# Patient Record
Sex: Female | Born: 1960 | Race: White | Hispanic: No | Smoking: Current every day smoker
Health system: Southern US, Community
[De-identification: ages and names within clinical notes are randomized; demographics above are authoritative.]

## PROBLEM LIST (undated history)

## (undated) ENCOUNTER — Emergency Department (HOSPITAL_COMMUNITY): Payer: Medicaid Other

## (undated) DIAGNOSIS — K219 Gastro-esophageal reflux disease without esophagitis: Secondary | ICD-10-CM

## (undated) HISTORY — PX: APPENDECTOMY: SHX54

---

## 2017-01-27 ENCOUNTER — Inpatient Hospital Stay (HOSPITAL_COMMUNITY)
Admission: AD | Admit: 2017-01-27 | Discharge: 2017-02-01 | DRG: 166 | Disposition: A | Discharge status: Home / Self Care | Payer: Medicaid Other | Source: Other Acute Inpatient Hospital | Attending: Internal Medicine | Admitting: Internal Medicine

## 2017-01-27 ENCOUNTER — Encounter (HOSPITAL_COMMUNITY): Payer: Self-pay | Admitting: Internal Medicine

## 2017-01-27 DIAGNOSIS — Z681 Body mass index (BMI) 19 or less, adult: Secondary | ICD-10-CM | POA: Diagnosis not present

## 2017-01-27 DIAGNOSIS — C3490 Malignant neoplasm of unspecified part of unspecified bronchus or lung: Secondary | ICD-10-CM

## 2017-01-27 DIAGNOSIS — R0602 Shortness of breath: Secondary | ICD-10-CM

## 2017-01-27 DIAGNOSIS — C3401 Malignant neoplasm of right main bronchus: Secondary | ICD-10-CM | POA: Diagnosis present

## 2017-01-27 DIAGNOSIS — R778 Other specified abnormalities of plasma proteins: Secondary | ICD-10-CM | POA: Diagnosis present

## 2017-01-27 DIAGNOSIS — R0689 Other abnormalities of breathing: Secondary | ICD-10-CM | POA: Diagnosis present

## 2017-01-27 DIAGNOSIS — J449 Chronic obstructive pulmonary disease, unspecified: Secondary | ICD-10-CM | POA: Diagnosis present

## 2017-01-27 DIAGNOSIS — J9601 Acute respiratory failure with hypoxia: Secondary | ICD-10-CM | POA: Diagnosis present

## 2017-01-27 DIAGNOSIS — K219 Gastro-esophageal reflux disease without esophagitis: Secondary | ICD-10-CM | POA: Diagnosis present

## 2017-01-27 DIAGNOSIS — T380X5A Adverse effect of glucocorticoids and synthetic analogues, initial encounter: Secondary | ICD-10-CM | POA: Diagnosis present

## 2017-01-27 DIAGNOSIS — F419 Anxiety disorder, unspecified: Secondary | ICD-10-CM | POA: Diagnosis present

## 2017-01-27 DIAGNOSIS — R042 Hemoptysis: Secondary | ICD-10-CM

## 2017-01-27 DIAGNOSIS — R918 Other nonspecific abnormal finding of lung field: Secondary | ICD-10-CM

## 2017-01-27 DIAGNOSIS — D473 Essential (hemorrhagic) thrombocythemia: Secondary | ICD-10-CM | POA: Diagnosis present

## 2017-01-27 DIAGNOSIS — F17210 Nicotine dependence, cigarettes, uncomplicated: Secondary | ICD-10-CM | POA: Diagnosis present

## 2017-01-27 DIAGNOSIS — R739 Hyperglycemia, unspecified: Secondary | ICD-10-CM | POA: Diagnosis present

## 2017-01-27 DIAGNOSIS — J441 Chronic obstructive pulmonary disease with (acute) exacerbation: Secondary | ICD-10-CM

## 2017-01-27 DIAGNOSIS — N390 Urinary tract infection, site not specified: Secondary | ICD-10-CM | POA: Diagnosis present

## 2017-01-27 DIAGNOSIS — Z7982 Long term (current) use of aspirin: Secondary | ICD-10-CM

## 2017-01-27 DIAGNOSIS — C7802 Secondary malignant neoplasm of left lung: Secondary | ICD-10-CM | POA: Diagnosis present

## 2017-01-27 DIAGNOSIS — J9859 Other diseases of mediastinum, not elsewhere classified: Secondary | ICD-10-CM

## 2017-01-27 DIAGNOSIS — C7801 Secondary malignant neoplasm of right lung: Secondary | ICD-10-CM | POA: Diagnosis present

## 2017-01-27 DIAGNOSIS — E43 Unspecified severe protein-calorie malnutrition: Secondary | ICD-10-CM | POA: Diagnosis present

## 2017-01-27 DIAGNOSIS — J209 Acute bronchitis, unspecified: Secondary | ICD-10-CM | POA: Diagnosis present

## 2017-01-27 HISTORY — DX: Gastro-esophageal reflux disease without esophagitis: K21.9

## 2017-01-27 LAB — CBC WITH DIFFERENTIAL/PLATELET
BASOS ABS: 0 10*3/uL (ref 0.0–0.1)
BASOS PCT: 0 %
Eosinophils Absolute: 0 10*3/uL (ref 0.0–0.7)
Eosinophils Relative: 0 %
HEMATOCRIT: 31.1 % — AB (ref 36.0–46.0)
HEMOGLOBIN: 10.3 g/dL — AB (ref 12.0–15.0)
LYMPHS PCT: 6 %
Lymphs Abs: 0.6 10*3/uL — ABNORMAL LOW (ref 0.7–4.0)
MCH: 24.5 pg — ABNORMAL LOW (ref 26.0–34.0)
MCHC: 33.1 g/dL (ref 30.0–36.0)
MCV: 73.9 fL — ABNORMAL LOW (ref 78.0–100.0)
MONO ABS: 0.1 10*3/uL (ref 0.1–1.0)
Monocytes Relative: 1 %
NEUTROS ABS: 8.9 10*3/uL — AB (ref 1.7–7.7)
NEUTROS PCT: 93 %
Platelets: 529 10*3/uL — ABNORMAL HIGH (ref 150–400)
RBC: 4.21 MIL/uL (ref 3.87–5.11)
RDW: 14.5 % (ref 11.5–15.5)
WBC: 9.6 10*3/uL (ref 4.0–10.5)

## 2017-01-27 LAB — MAGNESIUM: MAGNESIUM: 1.6 mg/dL — AB (ref 1.7–2.4)

## 2017-01-27 LAB — BLOOD GAS, ARTERIAL
Acid-Base Excess: 3 mmol/L — ABNORMAL HIGH (ref 0.0–2.0)
Bicarbonate: 27.3 mmol/L (ref 20.0–28.0)
Drawn by: 295031
O2 CONTENT: 4 L/min
O2 SAT: 95.6 %
PCO2 ART: 43.1 mmHg (ref 32.0–48.0)
PO2 ART: 83.3 mmHg (ref 83.0–108.0)
Patient temperature: 98.6
pH, Arterial: 7.418 (ref 7.350–7.450)

## 2017-01-27 LAB — COMPREHENSIVE METABOLIC PANEL
ALT: 10 U/L — ABNORMAL LOW (ref 14–54)
ANION GAP: 7 (ref 5–15)
AST: 16 U/L (ref 15–41)
Albumin: 2.7 g/dL — ABNORMAL LOW (ref 3.5–5.0)
Alkaline Phosphatase: 79 U/L (ref 38–126)
BUN: 6 mg/dL (ref 6–20)
CHLORIDE: 101 mmol/L (ref 101–111)
CO2: 26 mmol/L (ref 22–32)
CREATININE: 0.49 mg/dL (ref 0.44–1.00)
Calcium: 8.6 mg/dL — ABNORMAL LOW (ref 8.9–10.3)
GFR calc non Af Amer: >60 mL/min (ref >60)
Glucose, Bld: 184 mg/dL — ABNORMAL HIGH (ref 65–99)
POTASSIUM: 3.9 mmol/L (ref 3.5–5.1)
SODIUM: 134 mmol/L — AB (ref 135–145)
Total Bilirubin: 0.3 mg/dL (ref 0.3–1.2)
Total Protein: 7.3 g/dL (ref 6.5–8.1)

## 2017-01-27 LAB — BRAIN NATRIURETIC PEPTIDE: B NATRIURETIC PEPTIDE 5: 177 pg/mL — AB (ref 0.0–100.0)

## 2017-01-27 LAB — TROPONIN I: Troponin I: 0.63 ng/mL (ref <0.03)

## 2017-01-27 LAB — RETICULOCYTES
RBC.: 4.2 MIL/uL (ref 3.87–5.11)
RETIC CT PCT: 0.8 % (ref 0.4–3.1)
Retic Count, Absolute: 33.6 10*3/uL (ref 19.0–186.0)

## 2017-01-27 LAB — PROTIME-INR
INR: 1.12
PROTHROMBIN TIME: 14.5 s (ref 11.4–15.2)

## 2017-01-27 LAB — GLUCOSE, CAPILLARY: Glucose-Capillary: 139 mg/dL — ABNORMAL HIGH (ref 65–99)

## 2017-01-27 LAB — APTT: APTT: 32 s (ref 24–36)

## 2017-01-27 LAB — TSH: TSH: 0.153 u[IU]/mL — ABNORMAL LOW (ref 0.350–4.500)

## 2017-01-27 LAB — PHOSPHORUS: PHOSPHORUS: 3.3 mg/dL (ref 2.5–4.6)

## 2017-01-27 MED ORDER — IPRATROPIUM-ALBUTEROL 0.5-2.5 (3) MG/3ML IN SOLN
3.0000 mL | Freq: Three times a day (TID) | RESPIRATORY_TRACT | Status: DC
  Administered 2017-01-28: 3 mL via RESPIRATORY_TRACT
  Filled 2017-01-27: qty 3

## 2017-01-27 MED ORDER — NICOTINE 14 MG/24HR TD PT24
14.0000 mg | MEDICATED_PATCH | Freq: Every day | TRANSDERMAL | Status: DC
  Administered 2017-01-27 – 2017-02-01 (×6): 14 mg via TRANSDERMAL
  Filled 2017-01-27 (×6): qty 1

## 2017-01-27 MED ORDER — AZITHROMYCIN 250 MG PO TABS
250.0000 mg | ORAL_TABLET | Freq: Every day | ORAL | Status: DC
  Administered 2017-01-28 – 2017-01-30 (×3): 250 mg via ORAL
  Filled 2017-01-27 (×3): qty 1

## 2017-01-27 MED ORDER — KCL IN DEXTROSE-NACL 20-5-0.9 MEQ/L-%-% IV SOLN
INTRAVENOUS | Status: DC
  Administered 2017-01-27 – 2017-01-28 (×2): via INTRAVENOUS
  Filled 2017-01-27 (×2): qty 1000

## 2017-01-27 MED ORDER — ASPIRIN 325 MG PO TABS
325.0000 mg | ORAL_TABLET | Freq: Every day | ORAL | Status: DC
  Administered 2017-01-27 – 2017-01-28 (×2): 325 mg via ORAL
  Filled 2017-01-27 (×2): qty 1

## 2017-01-27 MED ORDER — FAMOTIDINE IN NACL 20-0.9 MG/50ML-% IV SOLN
20.0000 mg | Freq: Two times a day (BID) | INTRAVENOUS | Status: DC
  Administered 2017-01-27 – 2017-01-29 (×4): 20 mg via INTRAVENOUS
  Filled 2017-01-27 (×4): qty 50

## 2017-01-27 MED ORDER — ONDANSETRON HCL 4 MG/2ML IJ SOLN: 4.0000 mg | Freq: Four times a day (QID) | INTRAMUSCULAR | Status: DC | PRN

## 2017-01-27 MED ORDER — GUAIFENESIN ER 600 MG PO TB12
600.0000 mg | ORAL_TABLET | Freq: Two times a day (BID) | ORAL | Status: DC
  Administered 2017-01-27 – 2017-02-01 (×10): 600 mg via ORAL
  Filled 2017-01-27 (×10): qty 1

## 2017-01-27 MED ORDER — INSULIN ASPART 100 UNIT/ML ~~LOC~~ SOLN: 0.0000 [IU] | Freq: Every day | SUBCUTANEOUS | Status: DC

## 2017-01-27 MED ORDER — IPRATROPIUM-ALBUTEROL 0.5-2.5 (3) MG/3ML IN SOLN
3.0000 mL | Freq: Four times a day (QID) | RESPIRATORY_TRACT | Status: DC
  Administered 2017-01-27: 3 mL via RESPIRATORY_TRACT
  Filled 2017-01-27: qty 3

## 2017-01-27 MED ORDER — METHYLPREDNISOLONE SODIUM SUCC 125 MG IJ SOLR
125.0000 mg | Freq: Two times a day (BID) | INTRAMUSCULAR | Status: DC
  Administered 2017-01-27 – 2017-01-28 (×2): 125 mg via INTRAVENOUS
  Filled 2017-01-27 (×2): qty 2

## 2017-01-27 MED ORDER — ONDANSETRON HCL 4 MG PO TABS: 4.0000 mg | ORAL_TABLET | Freq: Four times a day (QID) | ORAL | Status: DC | PRN

## 2017-01-27 MED ORDER — INSULIN ASPART 100 UNIT/ML ~~LOC~~ SOLN: 0.0000 [IU] | Freq: Three times a day (TID) | SUBCUTANEOUS | Status: DC

## 2017-01-27 MED ORDER — HYDROCODONE-ACETAMINOPHEN 5-325 MG PO TABS
1.0000 | ORAL_TABLET | ORAL | Status: DC | PRN
Start: 2017-01-27 — End: 2017-02-01
  Administered 2017-01-29: 2 via ORAL
  Administered 2017-01-29 – 2017-02-01 (×8): 1 via ORAL
  Filled 2017-01-27 (×7): qty 1
  Filled 2017-01-27: qty 2
  Filled 2017-01-27 (×2): qty 1

## 2017-01-27 MED ORDER — BISACODYL 10 MG RE SUPP: 10.0000 mg | Freq: Every day | RECTAL | Status: DC | PRN

## 2017-01-27 MED ORDER — ACETAMINOPHEN 325 MG PO TABS: 325.0000 mg | ORAL_TABLET | Freq: Four times a day (QID) | ORAL | Status: DC | PRN

## 2017-01-27 MED ORDER — DEXTROSE 5 % IV SOLN
1.0000 g | INTRAVENOUS | Status: DC
  Administered 2017-01-27: 1 g via INTRAVENOUS
  Filled 2017-01-27 (×2): qty 10

## 2017-01-27 MED ORDER — AZITHROMYCIN 250 MG PO TABS
500.0000 mg | ORAL_TABLET | Freq: Every day | ORAL | Status: AC
  Administered 2017-01-27: 500 mg via ORAL
  Filled 2017-01-27: qty 2

## 2017-01-27 NOTE — H&P (Addendum)
History and Physical    Brittany Anthony QQV:[id number] DOB: 1960/11/24 DOA: 01/27/2017  Referring MD/NP/PA: EDP PCP: No primary care provider on file.   Outpatient Specialists:  Patient coming from: [facility] ED  Chief Complaint: SOB  HPI: Brittany Anthony is a 56 y.o. female with no significant medical history except GERD and 40 pyhx of tobacco abuse presenting with 2 weeks history of progressing worsening sob and cough productive bloody sputum associated with low grade fever without chills. No history of chest pain, diaphoresis, but her symptoms worsened over the last 3 days despite mucinex and therefore went to ED at [facility] for further evaluation.   ED Course: She was hypoxic with with wheezing and was treated with supllemental O2 and nebs. CXR was c/w COPD without pneuonia and her CT Angio was cooncerning for bronchiogenic pneumonia.  Patient is transferred to [facility] for onweard care  Review of Systems: She has lost about 30 lbs weight over the last year, otherwise as per HPI on 10 point review of systems negative  Past Medical History:  Diagnosis Date  . GERD (gastroesophageal reflux disease) 01/27/2017    No past surgical history on file.   has no tobacco, alcohol, and drug history on file.  No Known Allergies  No family history on file.   Prior to Admission medications   Medication Sig Start Date End Date Taking? Authorizing Provider  acetaminophen (TYLENOL) 325 MG tablet Take 325 mg by mouth every 6 (six) hours as needed for fever.   Yes Historical Provider, MD  aspirin 325 MG tablet Take 325 mg by mouth daily.   Yes Historical Provider, MD  guaiFENesin (MUCINEX) 600 MG 12 hr tablet Take 600 mg by mouth 2 (two) times daily.   Yes Historical Provider, MD    Physical Exam: Vitals:   01/27/17 1654  BP: 113/74  Pulse: 96  Resp: 18  Temp: 97.3 F (36.3 C)  TempSrc: Oral  SpO2: 97%  Weight: 47.5 kg (104 lb 11.2 oz)  Height: '5\' 7"'$  (1.702 m)       Constitutional: NAD, calm, comfortable Vitals:   01/27/17 1654  BP: 113/74  Pulse: 96  Resp: 18  Temp: 97.3 F (36.3 C)  TempSrc: Oral  SpO2: 97%  Weight: 47.5 kg (104 lb 11.2 oz)  Height: '5\' 7"'$  (1.702 m)   Eyes: PERRL, no scleral pallor ENMT: Mucous membranes are moist. Posterior pharynx clear of any exudate or lesions.Normal dentition.  Neck: normal, supple, no masses, no thyromegaly Respiratory: Diminished BS, wheezing, no crackles. Normal respiratory effort. No accessory muscle use.  Cardiovascular: Regular rate and rhythm, no murmurs / rubs / gallops. No extremity edema. 2+ pedal pulses. No carotid bruits.  Abdomen: no tenderness, no masses palpated. No hepatosplenomegaly. Bowel sounds positive.  Musculoskeletal: no clubbing / cyanosis. No joint deformity upper and lower extremities. Good ROM, no contractures. Normal muscle tone.  Skin: no rashes, lesions, ulcers. No induration Neurologic: CN 2-12 grossly intact. Sensation intact, DTR normal. Strength 5/5 in all 4.  Psychiatric: Normal judgment and insight. Alert and oriented x 3. Normal mood.    Labs on Admission: I have personally reviewed following labs and imaging studies from [facility] - of noted CXR - COPD, no pneumonic infiltrate. CTA - no PE,  mediastinal mass with with adenopathy concerning for bronchogenic carcinoma. CBC - leukocytosis, thrombocytosis, and microcytosis without anemia. Chemistries-hyperglycemia(152 mg/dl), Elevated Trop I of 0.75. Urinalysis c/w pyuria.  CBC: No results for input(s): WBC, NEUTROABS, HGB, HCT, MCV, PLT  in the last 168 hours. Basic Metabolic Panel: No results for input(s): NA, K, CL, CO2, GLUCOSE, BUN, CREATININE, CALCIUM, MG, PHOS in the last 168 hours. GFR: CrCl cannot be calculated (No order found.). Liver Function Tests: No results for input(s): AST, ALT, ALKPHOS, BILITOT, PROT, ALBUMIN in the last 168 hours. No results for input(s): LIPASE, AMYLASE in the last 168  hours. No results for input(s): AMMONIA in the last 168 hours. Coagulation Profile: No results for input(s): INR, PROTIME in the last 168 hours. Cardiac Enzymes: No results for input(s): CKTOTAL, CKMB, CKMBINDEX, TROPONINI in the last 168 hours. BNP (last 3 results) No results for input(s): PROBNP in the last 8760 hours. HbA1C: No results for input(s): HGBA1C in the last 72 hours. CBG: No results for input(s): GLUCAP in the last 168 hours. Lipid Profile: No results for input(s): CHOL, HDL, LDLCALC, TRIG, CHOLHDL, LDLDIRECT in the last 72 hours. Thyroid Function Tests: No results for input(s): TSH, T4TOTAL, FREET4, T3FREE, THYROIDAB in the last 72 hours. Anemia Panel: No results for input(s): VITAMINB12, FOLATE, FERRITIN, TIBC, IRON, RETICCTPCT in the last 72 hours. Urine analysis: No results found for: COLORURINE, APPEARANCEUR, LABSPEC, PHURINE, GLUCOSEU, HGBUR, BILIRUBINUR, KETONESUR, PROTEINUR, UROBILINOGEN, NITRITE, LEUKOCYTESUR Sepsis Labs: '@LABRCNTIP'$ (procalcitonin:4,lacticidven:4) )No results found for this or any previous visit (from the past 240 hour(s)).   Radiological Exams on Admission: No results found.  EKG: ordered  Assessment/Plan Principal Problem:   Hemoptysis Active Problems:   Lung mass   SOB (shortness of breath)   Acute bronchitis   Respiratory insufficiency   Acute respiratory insufficiency   Hyperglycemia   COPD exacerbation (HCC)  #1 COPD exacerbation due to acute bronchitis: Pulmonary toilet measures Antibiotic Supplemental oxygen Steroids Supportive care  #2 elevated troponin: No acute EKG changes-likely due to hemodynamic changes related to pulmonary presentation. Check cardiac enzymes 2-D echo  #3 mediastinal mass: Concerning for bronchogenic carcinoma Interventional radiology consult for biopsy  #4 hyperglycemia: No history of diabetes mellitus Check A1c  #5 hemoptysis: Likely due to diagnosis #2 with possible bronchogenic  carcinoma  follow clinically     DVT prophylaxis: (SCD's) Code Status:  (Full) Family Communication:  Disposition Plan: to be determined. Consults called: IR Admission status: (inpatient / tele )   OSEI-BONSU,Neveyah Garzon MD [facility] Pager [phone]  If 7PM-7AM, please contact night-coverage [email or web address] Password [facility]  01/27/2017, 5:58 PM

## 2017-01-28 ENCOUNTER — Encounter (HOSPITAL_COMMUNITY): Payer: Self-pay | Admitting: *Deleted

## 2017-01-28 ENCOUNTER — Other Ambulatory Visit (HOSPITAL_COMMUNITY): Payer: Self-pay

## 2017-01-28 DIAGNOSIS — R042 Hemoptysis: Secondary | ICD-10-CM

## 2017-01-28 DIAGNOSIS — J9859 Other diseases of mediastinum, not elsewhere classified: Secondary | ICD-10-CM

## 2017-01-28 DIAGNOSIS — R918 Other nonspecific abnormal finding of lung field: Secondary | ICD-10-CM

## 2017-01-28 DIAGNOSIS — E43 Unspecified severe protein-calorie malnutrition: Secondary | ICD-10-CM | POA: Insufficient documentation

## 2017-01-28 LAB — URINALYSIS, ROUTINE W REFLEX MICROSCOPIC
BACTERIA UA: NONE SEEN
BILIRUBIN URINE: NEGATIVE
GLUCOSE, UA: NEGATIVE mg/dL
KETONES UR: NEGATIVE mg/dL
NITRITE: NEGATIVE
PROTEIN: 30 mg/dL — AB
Specific Gravity, Urine: 1.025 (ref 1.005–1.030)
pH: 6 (ref 5.0–8.0)

## 2017-01-28 LAB — IRON AND TIBC
IRON: 13 ug/dL — AB (ref 28–170)
SATURATION RATIOS: 5 % — AB (ref 10.4–31.8)
TIBC: 252 ug/dL (ref 250–450)
UIBC: 239 ug/dL

## 2017-01-28 LAB — GLUCOSE, CAPILLARY
GLUCOSE-CAPILLARY: 139 mg/dL — AB (ref 65–99)
GLUCOSE-CAPILLARY: 124 mg/dL — AB (ref 65–99)
Glucose-Capillary: 136 mg/dL — ABNORMAL HIGH (ref 65–99)

## 2017-01-28 LAB — FERRITIN: FERRITIN: 133 ng/mL (ref 11–307)

## 2017-01-28 LAB — FOLATE: Folate: 20.1 ng/mL (ref >5.9)

## 2017-01-28 LAB — TROPONIN I
TROPONIN I: 0.46 ng/mL — AB (ref <0.03)
Troponin I: 0.36 ng/mL (ref <0.03)

## 2017-01-28 LAB — VITAMIN B12: Vitamin B-12: 276 pg/mL (ref 180–914)

## 2017-01-28 MED ORDER — PREMIER PROTEIN SHAKE
11.0000 [oz_av] | Freq: Two times a day (BID) | ORAL | Status: DC
  Administered 2017-01-28 – 2017-02-01 (×7): 11 [oz_av] via ORAL
  Filled 2017-01-28 (×9): qty 325.31

## 2017-01-28 MED ORDER — ALPRAZOLAM 0.5 MG PO TABS
0.5000 mg | ORAL_TABLET | Freq: Three times a day (TID) | ORAL | Status: DC | PRN
  Administered 2017-01-28 – 2017-02-01 (×8): 0.5 mg via ORAL
  Filled 2017-01-28 (×8): qty 1

## 2017-01-28 MED ORDER — ADULT MULTIVITAMIN W/MINERALS CH
1.0000 | ORAL_TABLET | Freq: Every day | ORAL | Status: DC
  Administered 2017-01-28 – 2017-01-31 (×2): 1 via ORAL
  Filled 2017-01-28 (×4): qty 1

## 2017-01-28 MED ORDER — METHYLPREDNISOLONE SODIUM SUCC 40 MG IJ SOLR
40.0000 mg | Freq: Every day | INTRAMUSCULAR | Status: DC
  Administered 2017-01-29: 40 mg via INTRAVENOUS
  Filled 2017-01-28: qty 1

## 2017-01-28 MED ORDER — IPRATROPIUM-ALBUTEROL 0.5-2.5 (3) MG/3ML IN SOLN
3.0000 mL | Freq: Four times a day (QID) | RESPIRATORY_TRACT | Status: DC
  Administered 2017-01-28 – 2017-01-30 (×9): 3 mL via RESPIRATORY_TRACT
  Filled 2017-01-28 (×9): qty 3

## 2017-01-28 MED ORDER — IPRATROPIUM-ALBUTEROL 0.5-2.5 (3) MG/3ML IN SOLN
3.0000 mL | RESPIRATORY_TRACT | Status: DC | PRN
  Administered 2017-01-29: 3 mL via RESPIRATORY_TRACT
  Filled 2017-01-28 (×2): qty 3

## 2017-01-28 MED ORDER — METHYLPREDNISOLONE SODIUM SUCC 40 MG IJ SOLR
40.0000 mg | Freq: Two times a day (BID) | INTRAMUSCULAR | Status: DC
  Administered 2017-01-28: 40 mg via INTRAVENOUS
  Filled 2017-01-28: qty 1

## 2017-01-28 NOTE — CV Procedure (Signed)
Patient stated she didn't want the echo at this time because she has visitors. I told her that if the echo wasn't done now then I would not be able to perform it until tomorrow. She states that was fine.   Brittany Anthony [facility] 01/28/17

## 2017-01-28 NOTE — Progress Notes (Signed)
Initial Nutrition Assessment  DOCUMENTATION CODES:   Severe malnutrition in context of chronic illness  INTERVENTION:   Premier Protein BID, each supplement provides 160kcal and 30g protein.   MVI  NUTRITION DIAGNOSIS:   Malnutrition related to catabolic illness as evidenced by 26 percent weight loss in one year, severe depletion of muscle mass, severe depletion of body fat.  GOAL:   Patient will meet greater than or equal to 90% of their needs  MONITOR:   PO intake, Supplement acceptance, Labs, Weight trends  REASON FOR ASSESSMENT:   Consult COPD Protocol  ASSESSMENT:   56 y.o. female with no significant medical history except GERD and 40 pyhx of tobacco abuse presenting with 2 weeks history of progressing worsening sob and cough productive bloody sputum associated with low grade fever without chills. Noted to have COPD exacerbation and lung mass   Met with pt in room today. Pt reports poor appetite for the past year. Per chart, pt has lost 36lbs(26%) over the past year; this is severe. Pt hungry today and wanting to eat. Pt NPO this morning for possible biopsy; now advanced to regular diet. Pt noted to have lung mass on admit. Plan is for pt to have lung biopsy possibly tomorrow. RD discussed with pt the importance of adequate protein intake to preserve lean muscle mass. Pt would like to try Premier Protein; RD will order.    Medications reviewed and include: aspirin, ceftriaxone, pepcid, insulin, solu-medrol, nicotine, NaCl w/ dextrose and KCl   Labs reviewed: Na 134(L), Ca 8.6(L) adj. 9.64 wnl, Mg 1.6(L), P 3.3 wnl, alb 2.7(L) Glucose- 184(H)  Nutrition-Focused physical exam completed. Findings are severe fat depletion in upper arms, severe muscle depletion over entire body, and no edema.   Diet Order:  Diet regular Room service appropriate? Yes; Fluid consistency: Thin  Skin:  Reviewed, no issues  Last BM:  3/31  Height:   Ht Readings from Last 1 Encounters:   01/27/17 _0  (1.702 m)    Weight:   Wt Readings from Last 1 Encounters:  01/27/17 104 lb 11.2 oz (47.5 kg)    Ideal Body Weight:  61.3 kg  BMI:  Body mass index is 16.4 kg/m.  Estimated Nutritional Needs:   Kcal:  1500-1800kcal/day   Protein:  71-80g/day   Fluid:  >1.5L/day   EDUCATION NEEDS:   No education needs identified at this time  Koleen Distance, RD, LDN Pager #[phone]

## 2017-01-28 NOTE — Consult Note (Signed)
Name: Brittany Anthony MRN: [id number] DOB: 1960/12/03    ADMISSION DATE:  01/27/2017 CONSULTATION DATE:  01/28/17  REFERRING MD :  DR Arrien  CHIEF COMPLAINT:  Abnormal CT chest  STUDIES:   HISTORY OF PRESENT ILLNESS:  56 yo woman, active smoker (40 pk-yrs), with little PMH except for suspected COPD, GERD. She presented to [facility] with approx 2 weeks of progressive cough and dyspnea, low grade fever, also with blood-tinged sputum for the last week. She has also experienced about 30 lbs unintentional wt loss over thew last year. CT chest from 01/27/17 was personally reviewed, shows an ill defined LUL nodule and a large mediastinal / R hilar mass with extrinsic compression of RUL airways and a possible endobronchial lesion. She was treated for possible AE-COPD and sent to [facility] for further w/u. Of note she had a mildly elevated troponin without ECG changes. Also has been noted to be hyperglycemic (on steroids).   PAST MEDICAL HISTORY :   has a past medical history of GERD (gastroesophageal reflux disease) (01/27/2017).  has no past surgical history on file. Prior to Admission medications   Medication Sig Start Date End Date Taking? Authorizing Provider  acetaminophen (TYLENOL) 325 MG tablet Take 325 mg by mouth every 6 (six) hours as needed for fever.   Yes Historical Provider, MD  aspirin 325 MG tablet Take 325 mg by mouth daily.   Yes Historical Provider, MD  guaiFENesin (MUCINEX) 600 MG 12 hr tablet Take 600 mg by mouth 2 (two) times daily.   Yes Historical Provider, MD   No Known Allergies  FAMILY HISTORY:  family history is not on file. SOCIAL HISTORY:  reports that she has been smoking Cigarettes.  She started smoking about 38 years ago. She has been smoking about 1.00 pack per day. She has never used smokeless tobacco. She reports that she does not drink alcohol or use drugs.  REVIEW OF SYSTEMS:   Constitutional: Negative for fever, chills, weight loss, malaise/fatigue and  diaphoresis.  HENT: Negative for hearing loss, ear pain, nosebleeds, congestion, sore throat, neck pain, tinnitus and ear discharge.   Eyes: Negative for blurred vision, double vision, photophobia, pain, discharge and redness.  Respiratory: Negative for cough, hemoptysis, sputum production, shortness of breath, wheezing and stridor.   Cardiovascular: Negative for chest pain, palpitations, orthopnea, claudication, leg swelling and PND.  Gastrointestinal: Negative for heartburn, nausea, vomiting, abdominal pain, diarrhea, constipation, blood in stool and melena.  Genitourinary: Negative for dysuria, urgency, frequency, hematuria and flank pain.  Musculoskeletal: Negative for myalgias, back pain, joint pain and falls.  Skin: Negative for itching and rash.  Neurological: Negative for dizziness, tingling, tremors, sensory change, speech change, focal weakness, seizures, loss of consciousness, weakness and headaches.  Endo/Heme/Allergies: Negative for environmental allergies and polydipsia. Does not bruise/bleed easily.  SUBJECTIVE: Patient continues to have dyspnea, cough productive of blood-tinged sputum as recently as this morning  VITAL SIGNS: Temp:  [97.3 F (36.3 C)-97.7 F (36.5 C)] 97.7 F (36.5 C) (04/02 0434) Pulse Rate:  [93-104] 93 (04/02 0434) Resp:  [18-20] 18 (04/02 0434) BP: (109-117)/(74-77) 117/77 (04/02 0434) SpO2:  [91 %-99 %] 91 % (04/02 1445) Weight:  [47.5 kg (104 lb 11.2 oz)] 47.5 kg (104 lb 11.2 oz) (04/01 1654)  PHYSICAL EXAMINATION: General:  Thin woman, NAD on RA Neuro:  Awake, alert, non-focal HEENT:  Op clear, no lesions Cardiovascular:  Regular, no M Lungs:  Bilateral soft expiratory rhonchi, right midlung inspiratory squeak Abdomen:  Benign Musculoskeletal:  No  deformity Skin:  No rash   Recent Labs Lab 01/27/17 1850  NA 134*  K 3.9  CL 101  CO2 26  BUN 6  CREATININE 0.49  GLUCOSE 184*    Recent Labs Lab 01/27/17 1850  HGB 10.3*  HCT 31.1*   WBC 9.6  PLT 529*   No results found.  ASSESSMENT / PLAN:  Abnormal CT scan of the chest. Mediastinal and right hilar mass encasing airways and filling the perihilar and subcarinal space. There is also an less well deformed left upper lobe nodule. There appears to be an endobronchial lesion in the right mainstem bronchus. 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 She will need bronchoscopy for tissue biopsy and diagnosis. I have arranged for tomorrow morning with Dr. Elsworth Soho.   Probable COPD. no clear evidence of an acute exacerbation on exam. Suspect that her symptoms are due to her hilar and mediastinal mass. Consider rapid taper of her corticosteroids. She will need formal PFT in the future. Likely will benefit from BD's as well.   Tobacco Abuse. Discussed cessation with her. She understands the importance and is motivated to try to stop.   Baltazar Apo, MD, PhD 01/28/2017, 4:42 PM [facility] [phone] or if no answer [phone]

## 2017-01-28 NOTE — Consult Note (Signed)
Chief Complaint: Patient was seen in consultation today for hemoptysis  Referring Physician(s):  Dr. Benito Mccreedy  Supervising Physician: Markus Daft  Patient Status: [facility] - In-pt  History of Present Illness: Brittany Anthony is a 56 y.o. female with past medical history of GERD presents to [facility] with shortness of breath and hemoptysis worsening over the past 3 days.  Patient has also experienced a 30 lbs weight loss in the past 3 months.   IR consulted for possible lung mass biopsy at the request of Dr. Iona Beard Osei-Bonsu. 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   Past Medical History:  Diagnosis Date  . GERD (gastroesophageal reflux disease) 01/27/2017    History reviewed. No pertinent surgical history.  Allergies: Patient has no known allergies.  Medications: Prior to Admission medications   Medication Sig Start Date End Date Taking? Authorizing Provider  acetaminophen (TYLENOL) 325 MG tablet Take 325 mg by mouth every 6 (six) hours as needed for fever.   Yes Historical Provider, MD  aspirin 325 MG tablet Take 325 mg by mouth daily.   Yes Historical Provider, MD  guaiFENesin (MUCINEX) 600 MG 12 hr tablet Take 600 mg by mouth 2 (two) times daily.   Yes Historical Provider, MD     History reviewed. No pertinent family history.  Social History   Social History  . Marital status: Unknown    Spouse name: N/A  . Number of children: N/A  . Years of education: N/A   Social History Main Topics  . Smoking status: Current Every Day Smoker    Packs/day: 1.00    Types: Cigarettes    Start date: 10/29/1978  . Smokeless tobacco: Never Used  . Alcohol use No  . Drug use: No  . Sexual activity: Not Currently    Partners: Male    Birth control/ protection: None     Comment: partner is husband    Other Topics Concern  . None   Social History Narrative  . None    Review of Systems  Constitutional: Positive  for fever. Negative for fatigue.  Respiratory: Positive for cough and shortness of breath.   Cardiovascular: Negative for chest pain.  Gastrointestinal: Negative for abdominal pain.  Psychiatric/Behavioral: Negative for behavioral problems and confusion.    Vital Signs: BP 117/77 (BP Location: Left Arm)   Pulse 93   Temp 97.7 F (36.5 C) (Oral)   Resp 18   Ht '5\' 7"'$  (1.702 m)   Wt 104 lb 11.2 oz (47.5 kg)   SpO2 99%   BMI 16.40 kg/m   Physical Exam  Constitutional: She is oriented to person, place, and time. She appears well-developed.  Cardiovascular: Normal rate, regular rhythm and normal heart sounds.   Pulmonary/Chest: Effort normal. No respiratory distress. She has rhonchi.  Neurological: She is alert and oriented to person, place, and time.  Skin: Skin is warm and dry.  Psychiatric: She has a normal mood and affect. Her behavior is normal. Judgment and thought content normal.  Nursing note and vitals reviewed.   Imaging: No results found.  Labs:  CBC:  Recent Labs  01/27/17 1850  WBC 9.6  HGB 10.3*  HCT 31.1*  PLT 529*    COAGS:  Recent Labs  01/27/17 1850  INR 1.12  APTT 32    BMP:  Recent Labs  01/27/17 1850  NA 134*  K 3.9  CL 101  CO2 26  GLUCOSE 184*  BUN 6  CALCIUM  8.6*  CREATININE 0.49  GFRNONAA >60  GFRAA >60    LIVER FUNCTION TESTS:  Recent Labs  01/27/17 1850  BILITOT 0.3  AST 16  ALT 10*  ALKPHOS 79  PROT 7.3  ALBUMIN 2.7*    TUMOR MARKERS: No results for input(s): AFPTM, CEA, CA199, CHROMGRNA in the last 8760 hours.  Assessment and Plan: Mediastinal mass Patient admitted with worsening shortness of breath and hemopytsis.  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  IR consulted for possible biopsy of the mediastinal mass.  Case reviewed by Dr. Markus Daft who feels procedure not appropriate due to limited window to lung mass on CT scan.   Recommend pulmonary consult for possible EBUS. No procedures planned in IR at this time.  Patient aware.  MD aware.   Thank you for this interesting consult.  I greatly enjoyed meeting Brittany Anthony and look forward to participating in their care.  A copy of this report was sent to the requesting provider on this date.  Electronically Signed: Docia Barrier 01/28/2017, 9:44 AM   I spent a total of 40 Minutes    in face to face in clinical consultation, greater than 50% of which was counseling/coordinating care for hemoptysis.

## 2017-01-28 NOTE — Progress Notes (Signed)
PROGRESS NOTE    Brittany Anthony  SLH:[id number] DOB: 27-Oct-1961 DOA: 01/27/2017 PCP: No primary care provider on file.    Brief Narrative:  56 yo female with tobacco abuse, presents with 2 weeks of progressive dyspnea and productive cough. Worse symptoms over last 3 days, associated with hemoptysis, presented to [facility]. Lung examination with decreased breath sounds. CT chest with changes suggesting bronchogenic carcinoma.    Assessment & Plan:   Principal Problem:   Hemoptysis Active Problems:   Lung mass   SOB (shortness of breath)   Acute bronchitis   Respiratory insufficiency   Acute respiratory insufficiency   Hyperglycemia   COPD exacerbation (HCC)   UTI (urinary tract infection)   Protein-calorie malnutrition, severe   1. COPD exacerbation. Will continue systemic steroids, bronchodilator therapy, oxymetry monitoring and supplemental 02 per [employer] to target 02 saturation above 92%. No apparent pneumonic infiltrates on reported imaging, will hold on ceftriaxone and will continue azithromycin.   2. Mediastinal mass, likely bronchogenic carcinoma. CT chest at [facility] positive for abnormal soft tissue mass surrounding the right hilar structures highly suspicious for primary bronchogenic carcinoma. 14 mm nodule on the left upper lobe and 4 mm nodule on the right lower lobe suspicious for metastasis. Bronchial wall thickening. Case discussed with IR, patient will be more candidate for endobronchial US guided biopsy. Pulmonary has been consulted.   3. Hemoptysis. Hb and Hct stable, bleeding possible due to malignancy, will order to collect expectorated sputum to quantify amount of hemoptysis. Not suspected to be massive.   4. Tobacco abuser. Smoking cessation.    DVT prophylaxis: scd Code Status: full  Family Communication: I spoke with patient's family at the bedside and all questions were addressed.  Disposition Plan: Home   Consultants:   IR  Pulmonology     Procedures:      Antimicrobials:   Ceftriaxone   Azithromycin    Subjective: Not in pain, dyspnea has improved but still persistent, no nausea or vomiting. Positive hemoptysis. No nausea or vomiting.   Objective: Vitals:   01/27/17 1654 01/27/17 2036 01/27/17 2120 01/28/17 0434  BP: 113/74  109/76 117/77  Pulse: 96  (!) 104 93  Resp: '18  20 18  '$ Temp: 97.3 F (36.3 C)  97.5 F (36.4 C) 97.7 F (36.5 C)  TempSrc: Oral  Oral Oral  SpO2: 97% 98% 95% 99%  Weight: 47.5 kg (104 lb 11.2 oz)     Height: '5\' 7"'$  (1.702 m)       Intake/Output Summary (Last 24 hours) at 01/28/17 1341 Last data filed at 01/28/17 0600  Gross per 24 hour  Intake          1233.33 ml  Output              900 ml  Net           333.33 ml   Filed Weights   01/27/17 1654  Weight: 47.5 kg (104 lb 11.2 oz)    Examination:  General exam: deconditioned E ENT: mild pallor, no icterus, oral mucosa moist.  Respiratory system: Decreased breath sounds with scattered rhonchi and rales, no wheezing. Cardiovascular system: S1 & S2 heard, RRR. No JVD, murmurs, rubs, gallops or clicks. No pedal edema. Gastrointestinal system: Abdomen is nondistended, soft and nontender. No organomegaly or masses felt. Normal bowel sounds heard. Central nervous system: Alert and oriented. No focal neurological deficits. Extremities: Symmetric 5 x 5 power. Skin: No rashes, lesions or ulcers  Data Reviewed: I have personally reviewed following labs and imaging studies  CBC:  Recent Labs Lab 01/27/17 1850  WBC 9.6  NEUTROABS 8.9*  HGB 10.3*  HCT 31.1*  MCV 73.9*  PLT 846*   Basic Metabolic Panel:  Recent Labs Lab 01/27/17 1850  NA 134*  K 3.9  CL 101  CO2 26  GLUCOSE 184*  BUN 6  CREATININE 0.49  CALCIUM 8.6*  MG 1.6*  PHOS 3.3   GFR: Estimated Creatinine Clearance: 60.3 mL/min (by C-G formula based on SCr of 0.49 mg/dL). Liver Function Tests:  Recent Labs Lab 01/27/17 1850  AST 16  ALT  10*  ALKPHOS 79  BILITOT 0.3  PROT 7.3  ALBUMIN 2.7*   No results for input(s): LIPASE, AMYLASE in the last 168 hours. No results for input(s): AMMONIA in the last 168 hours. Coagulation Profile:  Recent Labs Lab 01/27/17 1850  INR 1.12   Cardiac Enzymes:  Recent Labs Lab 01/27/17 1850 01/28/17 0014 01/28/17 0440  TROPONINI 0.63* 0.46* 0.36*   BNP (last 3 results) No results for input(s): PROBNP in the last 8760 hours. HbA1C: No results for input(s): HGBA1C in the last 72 hours. CBG:  Recent Labs Lab 01/27/17 2234 01/28/17 0753 01/28/17 1214  GLUCAP 139* 139* 136*   Lipid Profile: No results for input(s): CHOL, HDL, LDLCALC, TRIG, CHOLHDL, LDLDIRECT in the last 72 hours. Thyroid Function Tests:  Recent Labs  01/27/17 1850  TSH 0.153*   Anemia Panel:  Recent Labs  01/27/17 1850  VITAMINB12 276  FOLATE 20.1  FERRITIN 133  TIBC 252  IRON 13*  RETICCTPCT 0.8   Sepsis Labs: No results for input(s): PROCALCITON, LATICACIDVEN in the last 168 hours.  No results found for this or any previous visit (from the past 240 hour(s)).       Radiology Studies: No results found.      Scheduled Meds: . azithromycin  250 mg Oral Daily  . cefTRIAXone (ROCEPHIN)  IV  1 g Intravenous Q24H  . famotidine (PEPCID) IV  20 mg Intravenous Q12H  . guaiFENesin  600 mg Oral BID  . ipratropium-albuterol  3 mL Nebulization TID  . methylPREDNISolone (SOLU-MEDROL) injection  40 mg Intravenous Q12H  . multivitamin with minerals  1 tablet Oral Daily  . nicotine  14 mg Transdermal Daily  . protein supplement shake  11 oz Oral BID BM   Continuous Infusions:   LOS: 1 day       [name] Gerome Apley, MD [facility] Pager [phone]  If 7PM-7AM, please contact night-coverage [email or web address] Password [email or web address] 01/28/2017, 1:41 PM

## 2017-01-29 ENCOUNTER — Encounter (HOSPITAL_COMMUNITY): Payer: Self-pay | Admitting: Respiratory Therapy

## 2017-01-29 ENCOUNTER — Inpatient Hospital Stay (HOSPITAL_COMMUNITY): Payer: Medicaid Other

## 2017-01-29 ENCOUNTER — Ambulatory Visit
Admit: 2017-01-29 | Discharge: 2017-01-29 | Disposition: A | Discharge status: Home / Self Care | Payer: Self-pay | Attending: Radiation Oncology | Admitting: Radiation Oncology

## 2017-01-29 ENCOUNTER — Encounter (HOSPITAL_COMMUNITY)
Admission: AD | Disposition: A | Discharge status: Home / Self Care | Payer: Self-pay | Source: Other Acute Inpatient Hospital | Attending: Internal Medicine

## 2017-01-29 DIAGNOSIS — R918 Other nonspecific abnormal finding of lung field: Secondary | ICD-10-CM

## 2017-01-29 DIAGNOSIS — R0602 Shortness of breath: Secondary | ICD-10-CM

## 2017-01-29 DIAGNOSIS — J441 Chronic obstructive pulmonary disease with (acute) exacerbation: Secondary | ICD-10-CM

## 2017-01-29 HISTORY — PX: VIDEO BRONCHOSCOPY: SHX5072

## 2017-01-29 LAB — CBC WITH DIFFERENTIAL/PLATELET
BASOS ABS: 0 10*3/uL (ref 0.0–0.1)
BASOS PCT: 0 %
Eosinophils Absolute: 0 10*3/uL (ref 0.0–0.7)
Eosinophils Relative: 0 %
HEMATOCRIT: 33.9 % — AB (ref 36.0–46.0)
HEMOGLOBIN: 11.1 g/dL — AB (ref 12.0–15.0)
Lymphocytes Relative: 10 %
Lymphs Abs: 2.7 10*3/uL (ref 0.7–4.0)
MCH: 24.9 pg — ABNORMAL LOW (ref 26.0–34.0)
MCHC: 32.7 g/dL (ref 30.0–36.0)
MCV: 76 fL — ABNORMAL LOW (ref 78.0–100.0)
MONOS PCT: 5 %
Monocytes Absolute: 1.5 10*3/uL — ABNORMAL HIGH (ref 0.1–1.0)
NEUTROS ABS: 22.7 10*3/uL — AB (ref 1.7–7.7)
NEUTROS PCT: 85 %
Platelets: 561 10*3/uL — ABNORMAL HIGH (ref 150–400)
RBC: 4.46 MIL/uL (ref 3.87–5.11)
RDW: 14.9 % (ref 11.5–15.5)
WBC: 26.8 10*3/uL — AB (ref 4.0–10.5)

## 2017-01-29 LAB — BASIC METABOLIC PANEL
ANION GAP: 9 (ref 5–15)
BUN: 12 mg/dL (ref 6–20)
CHLORIDE: 101 mmol/L (ref 101–111)
CO2: 30 mmol/L (ref 22–32)
Calcium: 9.3 mg/dL (ref 8.9–10.3)
Creatinine, Ser: 0.58 mg/dL (ref 0.44–1.00)
GFR calc non Af Amer: >60 mL/min (ref >60)
Glucose, Bld: 108 mg/dL — ABNORMAL HIGH (ref 65–99)
POTASSIUM: 3.8 mmol/L (ref 3.5–5.1)
Sodium: 140 mmol/L (ref 135–145)

## 2017-01-29 LAB — HIV ANTIBODY (ROUTINE TESTING W REFLEX): HIV SCREEN 4TH GENERATION: NONREACTIVE

## 2017-01-29 LAB — GLUCOSE, CAPILLARY
Glucose-Capillary: 109 mg/dL — ABNORMAL HIGH (ref 65–99)
Glucose-Capillary: 82 mg/dL (ref 65–99)

## 2017-01-29 LAB — HEMOGLOBIN A1C
HEMOGLOBIN A1C: 6 % — AB (ref 4.8–5.6)
MEAN PLASMA GLUCOSE: 126 mg/dL

## 2017-01-29 LAB — ECHOCARDIOGRAM COMPLETE
HEIGHTINCHES: 67 in
Weight: 1675.2 oz

## 2017-01-29 SURGERY — VIDEO BRONCHOSCOPY WITHOUT FLUORO
Anesthesia: Moderate Sedation | Laterality: Bilateral

## 2017-01-29 MED ORDER — LIDOCAINE HCL 2 % EX GEL: 1.0000 "application " | Freq: Once | CUTANEOUS | Status: DC

## 2017-01-29 MED ORDER — LIDOCAINE HCL 2 % EX GEL
CUTANEOUS | Status: DC | PRN
  Administered 2017-01-29: 1

## 2017-01-29 MED ORDER — HYDROCOD POLST-CPM POLST ER 10-8 MG/5ML PO SUER
5.0000 mL | Freq: Once | ORAL | Status: AC
  Administered 2017-01-29: 5 mL via ORAL
  Filled 2017-01-29: qty 5

## 2017-01-29 MED ORDER — LIDOCAINE HCL 1 % IJ SOLN
INTRAMUSCULAR | Status: DC | PRN
  Administered 2017-01-29: 6 mL via RESPIRATORY_TRACT

## 2017-01-29 MED ORDER — FENTANYL CITRATE (PF) 100 MCG/2ML IJ SOLN
INTRAMUSCULAR | Status: AC
  Filled 2017-01-29: qty 4

## 2017-01-29 MED ORDER — PHENYLEPHRINE HCL 0.25 % NA SOLN
1.0000 | Freq: Four times a day (QID) | NASAL | Status: DC | PRN
  Filled 2017-01-29: qty 15

## 2017-01-29 MED ORDER — MIDAZOLAM HCL 10 MG/2ML IJ SOLN
INTRAMUSCULAR | Status: DC | PRN
  Administered 2017-01-29 (×6): 1 mg via INTRAVENOUS

## 2017-01-29 MED ORDER — FAMOTIDINE 20 MG PO TABS
20.0000 mg | ORAL_TABLET | Freq: Two times a day (BID) | ORAL | Status: DC
  Administered 2017-01-29 – 2017-02-01 (×6): 20 mg via ORAL
  Filled 2017-01-29 (×6): qty 1

## 2017-01-29 MED ORDER — PHENYLEPHRINE HCL 0.25 % NA SOLN: 1.0000 | Freq: Four times a day (QID) | NASAL | Status: DC | PRN

## 2017-01-29 MED ORDER — PHENYLEPHRINE HCL 0.25 % NA SOLN
NASAL | Status: DC | PRN
  Administered 2017-01-29: 2 via NASAL

## 2017-01-29 MED ORDER — MIDAZOLAM HCL 5 MG/ML IJ SOLN
INTRAMUSCULAR | Status: AC
  Filled 2017-01-29: qty 2

## 2017-01-29 MED ORDER — FENTANYL CITRATE (PF) 100 MCG/2ML IJ SOLN
INTRAMUSCULAR | Status: DC | PRN
  Administered 2017-01-29 (×6): 25 ug via INTRAVENOUS

## 2017-01-29 MED ORDER — SODIUM CHLORIDE 0.9 % IV SOLN
INTRAVENOUS | Status: DC
  Administered 2017-01-29: 11:00:00 via INTRAVENOUS

## 2017-01-29 MED ORDER — BUTAMBEN-TETRACAINE-BENZOCAINE 2-2-14 % EX AERO
1.0000 | INHALATION_SPRAY | Freq: Once | CUTANEOUS | Status: DC
Start: 2017-01-29 — End: 2017-02-01

## 2017-01-29 NOTE — Progress Notes (Signed)
To procedure room for Bronchoscopy

## 2017-01-29 NOTE — Progress Notes (Signed)
Radiation Oncology         ([phone]) [phone] ________________________________  Name: Brittany Anthony MRN: [id number]  Date: 01/29/17  DOB: 1961-05-15   REFERRING PHYSICIAN: Dr. Elsworth Soho  DIAGNOSIS: The encounter diagnosis was Mediastinal mass.   HISTORY OF PRESENT ILLNESS: Brittany Anthony is a 56 y.o. female seen at the request of Dr. Elsworth Soho for a new presumed lung cancer. The patient has a 40 pack year history of cigarette smoking and presented to [facility] for acute onset of shortness of breath with 30 pound weight loss in the last year as well as hemoptysis. She was found to have a mediastinal mass measuring about 6 x7 cm with mediastinal adenopathy and a left upper lobe nodule. She has been evaluated by pulmonary service and has undergone bronchoscopy this morning. She has been hemodynamically stable and has not required any blood products. We are asked to see her to assist in radiotherapy to the chest.    PREVIOUS RADIATION THERAPY: No   PAST MEDICAL HISTORY:  Past Medical History:  Diagnosis Date  . GERD (gastroesophageal reflux disease) 01/27/2017       PAST SURGICAL HISTORY: Past Surgical History:  Procedure Laterality Date  . APPENDECTOMY       FAMILY HISTORY:  Family History  Problem Relation Age of Onset  . Pancreatic cancer Mother   . Lung cancer Maternal Grandmother      SOCIAL HISTORY:  reports that she has been smoking Cigarettes.  She started smoking about 38 years ago. She has been smoking about 1.00 pack per day. She has never used smokeless tobacco. She reports that she does not drink alcohol or use drugs. The patient lives in [place], [place]. She is unemployed and her daughter lives in [place].    ALLERGIES: Patient has no known allergies.   MEDICATIONS:  Current Facility-Administered Medications  Medication Dose Route Frequency Provider Last Rate Last Dose  . 0.9 %  sodium chloride infusion   Intravenous Continuous Rigoberto Noel, MD 10 mL/hr at  01/29/17 1039    . acetaminophen (TYLENOL) tablet 325 mg  325 mg Oral Q6H PRN Benito Mccreedy, MD      . ALPRAZolam Duanne Moron) tablet 0.5 mg  0.5 mg Oral TID PRN Tawni Millers, MD   0.5 mg at 01/28/17 2244  . azithromycin (ZITHROMAX) tablet 250 mg  250 mg Oral Daily Benito Mccreedy, MD   250 mg at 01/29/17 0831  . bisacodyl (DULCOLAX) suppository 10 mg  10 mg Rectal Daily PRN Benito Mccreedy, MD      . butamben-tetracaine-benzocaine (CETACAINE) spray 1 spray  1 spray Topical Once Kara Mead V, MD      . famotidine (PEPCID) tablet 20 mg  20 mg Oral BID Dara Hoyer, RPH      . guaiFENesin (MUCINEX) 12 hr tablet 600 mg  600 mg Oral BID Benito Mccreedy, MD   600 mg at 01/29/17 0831  . HYDROcodone-acetaminophen (NORCO/VICODIN) 5-325 MG per tablet 1-2 tablet  1-2 tablet Oral Q4H PRN Benito Mccreedy, MD   1 tablet at 01/29/17 0606  . ipratropium-albuterol (DUONEB) 0.5-2.5 (3) MG/3ML nebulizer solution 3 mL  3 mL Nebulization Q2H PRN Tawni Millers, MD   3 mL at 01/29/17 4008  . ipratropium-albuterol (DUONEB) 0.5-2.5 (3) MG/3ML nebulizer solution 3 mL  3 mL Nebulization Q6H Mauricio Gerome Apley, MD   3 mL at 01/29/17 1441  . lidocaine (XYLOCAINE) 2 % jelly 1 application  1 application Topical Once Rigoberto Noel, MD      .  multivitamin with minerals tablet 1 tablet  1 tablet Oral Daily Mauricio Gerome Apley, MD   1 tablet at 01/28/17 1629  . nicotine (NICODERM CQ - dosed in mg/24 hours) patch 14 mg  14 mg Transdermal Daily Benito Mccreedy, MD   14 mg at 01/29/17 6301  . ondansetron (ZOFRAN) tablet 4 mg  4 mg Oral Q6H PRN Benito Mccreedy, MD       Or  . ondansetron (ZOFRAN) injection 4 mg  4 mg Intravenous Q6H PRN Benito Mccreedy, MD      . phenylephrine (NEO-SYNEPHRINE) 0.25 % nasal spray 1 spray  1 spray Each Nare Q6H PRN Kara Mead V, MD      . protein supplement (PREMIER PROTEIN) liquid  11 oz Oral BID BM Mauricio Gerome Apley, MD   11 oz at 01/29/17 1412     REVIEW OF SYSTEMS: On review of systems, the patient reports that she is doing well overall since receiving oxygen. She denies any current chest pain, shortness of breath, cough, fevers, chills, night sweats, unintended weight changes. She denies any bowel or bladder disturbances, and denies abdominal pain, nausea or vomiting. She denies any new musculoskeletal or joint aches or pains. A complete review of systems is obtained and is otherwise negative.     PHYSICAL EXAM:  Wt Readings from Last 3 Encounters:  01/27/17 104 lb 11.2 oz (47.5 kg)   Temp Readings from Last 3 Encounters:  01/29/17 98.9 F (37.2 C) (Oral)   BP Readings from Last 3 Encounters:  01/29/17 96/71   Pulse Readings from Last 3 Encounters:  01/29/17 (!) 107   Pain Assessment Pain Score: 0-No pain/10  In general this is a thin, chronically ill appearing caucasian in no acute distress. She is alert and oriented x4 and appropriate throughout the examination. HEENT reveals that the patient is normocephalic, atraumatic. EOMs are intact. PERRLA. Skin is intact without any evidence of gross lesions. Cardiovascular exam reveals a regular rate and rhythm, no clicks rubs or murmurs are auscultated. Chest is clear to auscultation with rhonchi bilaterally. Lymphatic assessment is performed and does not reveal any adenopathy in the cervical, supraclavicular, axillary, or inguinal chains. Abdomen has active bowel sounds in all quadrants and is intact. The abdomen is soft, non tender, non distended. Lower extremities are negative for pretibial pitting edema, deep calf tenderness, cyanosis or clubbing.   ECOG = 2  0 - Asymptomatic (Fully active, able to carry on all predisease activities without restriction)  1 - Symptomatic but completely ambulatory (Restricted in physically strenuous activity but ambulatory and able to carry out work of a light or sedentary nature. For example, light housework, office work)  2 - Symptomatic,  <50% in bed during the day (Ambulatory and capable of all self care but unable to carry out any work activities. Up and about more than 50% of waking hours)  3 - Symptomatic, >50% in bed, but not bedbound (Capable of only limited self-care, confined to bed or chair 50% or more of waking hours)  4 - Bedbound (Completely disabled. Cannot carry on any self-care. Totally confined to bed or chair)  5 - Death   Eustace Pen MM, Creech RH, Tormey DC, et al. [phone]). "Toxicity and response criteria of the [facility]". [place] Oncol. 5 (6): 649-55    LABORATORY DATA:  Lab Results  Component Value Date   WBC 26.8 (H) 01/29/2017   HGB 11.1 (L) 01/29/2017   HCT 33.9 (L) 01/29/2017   MCV  76.0 (L) 01/29/2017   PLT 561 (H) 01/29/2017   Lab Results  Component Value Date   NA 140 01/29/2017   K 3.8 01/29/2017   CL 101 01/29/2017   CO2 30 01/29/2017   Lab Results  Component Value Date   ALT 10 (L) 01/27/2017   AST 16 01/27/2017   ALKPHOS 79 01/27/2017   BILITOT 0.3 01/27/2017      RADIOGRAPHY: No results found.     IMPRESSION/PLAN: 1. Putative Stage III Lung cancer. The patient is counseled on the imaging studies and the concerns for probable stage III disease. We will await the results from her pathology which was collected today. We anticipate simulation tomorrow to the chest/mediastinum and anticipate starting radiotherapy on Thursday. We discussed the options for a short course of palliative radiotherapy with the option to extend her treatment to a concurrent course with chemotherapy based on her final histology. As she lives in [place], she would be interested in having a medical oncologist and to finish out a course of radiation at [facility]. Dr. Sondra Come has recommended a course of radiotherapy for 10 fractions,  and we discussed the risks, benefits, short, and long term effects of radiotherapy, and the patient is interested in proceeding. We also  discussed the delivery and logistics of radiotherapy. She is interested in proceeding and simulate tomorrow at 9am. Written consent will be obtained at that time as well.   In a visit lasting 70 minutes, greater than 50% of the time was spent face to face discussing her options for treatment and in floor time coordinating the patient's care.  The above documentation reflects my direct findings during this shared patient visit. Please see the separate note by Dr. Sondra Come on this date for the remainder of the patient's plan of care.     Carola Rhine, PAC

## 2017-01-29 NOTE — Progress Notes (Signed)
Video Bronchoscopy done  Intervention Bronchial washing  x2 Intervention Bronchial biopsy Intervention Bronchial  Brushings  Procedure tolerated well

## 2017-01-29 NOTE — Progress Notes (Signed)
  Echocardiogram 2D Echocardiogram has been performed.  Darlina Sicilian M 01/29/2017, 1:20 PM2

## 2017-01-29 NOTE — Op Note (Signed)
[facility] Cardiopulmonary Patient Name: Brittany Anthony Procedure Date: 01/29/2017 MRN: [id number] Attending MD: Leanna Sato. Elsworth Soho MD, MD Date of Birth: [birth year] CSN: [id number] Age: 56 Admit Type: Inpatient Ethnicity: Not Hispanic or Latino Procedure:            Bronchoscopy Indications:          Right mainstem mass, Hemoptysis with abnormal CXR,                        Mediastinal adenopathy Providers:            Leanna Sato. Elsworth Soho MD, MD, Andre Lefort RRT,RCP, Cherre Huger RRT, RCP Referring MD:          Medicines:            Midazolam 6 mg IV, Fentanyl 150 mcg IV, Lidocaine                        applied to nares and subglottic space Complications:        No immediate complications. Estimated blood loss:                        Minimal Estimated Blood Loss: Estimated blood loss: Procedure:      Pre-Anesthesia Assessment:      - A History and Physical has been performed. Patient meds and allergies       have been reviewed. The risks and benefits of the procedure and the       sedation options and risks were discussed with the patient. All       questions were answered and informed consent was obtained. Patient       identification and proposed procedure were verified prior to the       procedure. Mental Status Examination: normal. Airway Examination: normal       oropharyngeal airway. Respiratory Examination: clear to auscultation. CV       Examination: normal. ASA Grade Assessment: II - A patient with mild       systemic disease. After reviewing the risks and benefits, the patient       was deemed in satisfactory condition to undergo the procedure. The       anesthesia plan was to use moderate sedation / analgesia (conscious       sedation). Immediately prior to administration of medications, the       patient was re-assessed for adequacy to receive sedatives. The heart       rate, respiratory rate, oxygen saturations, blood pressure,  adequacy of       pulmonary ventilation, and response to care were monitored throughout       the procedure. The physical status of the patient was re-assessed after       the procedure.      After obtaining informed consent, the bronchoscope was passed under       direct vision. Throughout the procedure, the patient's blood pressure,       pulse, and oxygen saturations were monitored continuously. the [id number]       ([id number]) scope was introduced through the left nostril and advanced to       the tracheobronchial tree. The patient tolerated the procedure. Findings:      Specific locations: The following were directly visualized,  but are       abnormal: left mainstem bronchus and right mainstem bronchus -       endobronchial lesions noted. these were brushed & biopsied. Bleeding       from left mainstem lesion even prior to staring procedure      Brushings of a lesion were obtained in the right mainstem bronchus and       in the left mainstem bronchus and sent for routine cytology.      Endobronchial biopsies of a lesion were performed in the right mainstem       bronchus and in the left mainstem bronchus and sent for histopathology       examination.      Bronchoalveolar lavage was performed in the lung. The return was bloody. Impression:      - Right mainstem mass      - Hemoptysis with abnormal CXR      - Mediastinal adenopathy      - No specimens collected. Moderate Sedation:      Moderate (conscious) sedation was personally administered by the       endoscopist. The following parameters were monitored: oxygen saturation,       heart rate, blood pressure, and response to care. Total physician       intraservice time was 21 minutes. Recommendation:      - Await test results. Procedure Code(s):      --- Professional ---      [phone], Bronchoscopy, rigid or flexible, including fluoroscopic guidance,       when performed; diagnostic, with cell washing, when performed (separate        procedure)      99152, Moderate sedation services provided by the same physician or       other qualified health care professional performing the diagnostic or       therapeutic service that the sedation supports, requiring the presence       of an independent trained observer to assist in the monitoring of the       patient's level of consciousness and physiological status; initial 15       minutes of intraservice time, patient age 61 years or older Diagnosis Code(s):      --- Professional ---      R91.8, Other nonspecific abnormal finding of lung field      R04.2, Hemoptysis      R59.9, Enlarged lymph nodes, unspecified CPT copyright 2016 American Medical Association. All rights reserved. The codes documented in this report are preliminary and upon coder review may  be revised to meet current compliance requirements. Kara Mead, MD Leanna Sato Elsworth Soho MD, MD 01/29/2017 11:29:08 AM This report has been signed electronically. Number of Addenda: 0 Scope In: 10:57:42 AM Scope Out: 11:18:39 AM

## 2017-01-29 NOTE — Progress Notes (Signed)
Patient woke up this Am complaining of upper left sided chest pain of 7/10 that hurts worse when she takes deep breathes and coughs. RN gave patient one pain pill and called respiratory to give a breathing treatment. On call was notified of the new pain and new orders were given for a one time dose of tussinex.

## 2017-01-29 NOTE — Progress Notes (Addendum)
PROGRESS NOTE    Brittany Anthony  ZJQ:[id number] DOB: 1960/12/09 DOA: 01/27/2017 PCP: No primary care provider on file.    Brief Narrative:  56 yo female with tobacco abuse, presents with 2 weeks of progressive dyspnea and productive cough. Worse symptoms over last 3 days, associated with hemoptysis, presented to [facility]. Lung examination with decreased breath sounds. CT chest with changes suggesting bronchogenic carcinoma. Consulted pulmonary for diagnostic bronchoscopy.   Assessment & Plan:   Principal Problem:   Hemoptysis Active Problems:   Lung mass   SOB (shortness of breath)   Acute bronchitis   Respiratory insufficiency   Acute respiratory insufficiency   Hyperglycemia   COPD exacerbation (HCC)   UTI (urinary tract infection)   Protein-calorie malnutrition, severe   1. COPD exacerbation. Noted significant leukocytosis, suspected to be reactive, no further wheezing, will hold on  systemic steroids. Will continue azithromycin and bronchodilator therapy, oxymetry monitoring and supplemental 02 per [place] to target 02 saturation above 92%.  2. Mediastinal mass, likely bronchogenic carcinoma. Patient for diagnostic bronchoscopy today per pulmonary service. Imaging from [facility] with a very high pretest probability for bronchial malignancy (CT chest at [facility] positive for abnormal soft tissue mass surrounding the right hilar structures highly suspicious for primary bronchogenic carcinoma. 14 mm nodule on the left upper lobe and 4 mm nodule on the right lower lobe suspicious for metastasis. Bronchial wall thickening). Will follow on report and further recommendations.    3. Hemoptysis. Not massive, Hb stable at 10.3 and 11.1 Collected about 10 cc of hemoptysis.    4. Tobacco abuser. Continue to encourage aggressive Smoking cessation  5. Severe malnutrition. Will continue on nutritional supplements.    Subjective: Dyspnea has been improving, continue  cough, decrease amount of hemoptysis, yesterday collected about 10 cc. No nausea or vomiting, no chest pain.   Objective: Vitals:   01/29/17 1205 01/29/17 1210 01/29/17 1215 01/29/17 1220  BP: 91/63 98/61 107/72   Pulse:      Resp: '17 15 17 19  '$ Temp:      TempSrc:      SpO2: 93% 93% 92%   Weight:      Height:        Intake/Output Summary (Last 24 hours) at 01/29/17 1426 Last data filed at 01/29/17 0820  Gross per 24 hour  Intake              480 ml  Output              317 ml  Net              163 ml   Filed Weights   01/27/17 1654  Weight: 47.5 kg (104 lb 11.2 oz)    Examination:  General exam: Not in pain or dyspnea E ENT: mild pallor, no icterus, oral mucosa moist  Respiratory system: scattered rales and rhonchi, no wheezing.  Respiratory effort normal. Cardiovascular system: S1 & S2 heard, RRR. No JVD, murmurs, rubs, gallops or clicks. No pedal edema. Gastrointestinal system: Abdomen is nondistended, soft and nontender. No organomegaly or masses felt. Normal bowel sounds heard. Central nervous system: Alert and oriented. No focal neurological deficits. Extremities: Symmetric 5 x 5 power. Skin: No rashes, lesions or ulcers   Data Reviewed: I have personally reviewed following labs and imaging studies  CBC:  Recent Labs Lab 01/27/17 1850 01/29/17 0545  WBC 9.6 26.8*  NEUTROABS 8.9* 22.7*  HGB 10.3* 11.1*  HCT 31.1* 33.9*  MCV 73.9* 76.0*  PLT 529* 962*   Basic Metabolic Panel:  Recent Labs Lab 01/27/17 1850 01/29/17 0545  NA 134* 140  K 3.9 3.8  CL 101 101  CO2 26 30  GLUCOSE 184* 108*  BUN 6 12  CREATININE 0.49 0.58  CALCIUM 8.6* 9.3  MG 1.6*  --   PHOS 3.3  --    GFR: Estimated Creatinine Clearance: 60.3 mL/min (by C-G formula based on SCr of 0.58 mg/dL). Liver Function Tests:  Recent Labs Lab 01/27/17 1850  AST 16  ALT 10*  ALKPHOS 79  BILITOT 0.3  PROT 7.3  ALBUMIN 2.7*   No results for input(s): LIPASE, AMYLASE in the last  168 hours. No results for input(s): AMMONIA in the last 168 hours. Coagulation Profile:  Recent Labs Lab 01/27/17 1850  INR 1.12   Cardiac Enzymes:  Recent Labs Lab 01/27/17 1850 01/28/17 0014 01/28/17 0440  TROPONINI 0.63* 0.46* 0.36*   BNP (last 3 results) No results for input(s): PROBNP in the last 8760 hours. HbA1C:  Recent Labs  01/27/17 1850  HGBA1C 6.0*   CBG:  Recent Labs Lab 01/27/17 2234 01/28/17 0753 01/28/17 1214 01/28/17 1710 01/29/17 0815  GLUCAP 139* 139* 136* 124* 109*   Lipid Profile: No results for input(s): CHOL, HDL, LDLCALC, TRIG, CHOLHDL, LDLDIRECT in the last 72 hours. Thyroid Function Tests:  Recent Labs  01/27/17 1850  TSH 0.153*   Anemia Panel:  Recent Labs  01/27/17 1850  VITAMINB12 276  FOLATE 20.1  FERRITIN 133  TIBC 252  IRON 13*  RETICCTPCT 0.8   Sepsis Labs: No results for input(s): PROCALCITON, LATICACIDVEN in the last 168 hours.  No results found for this or any previous visit (from the past 240 hour(s)).       Radiology Studies: No results found.      Scheduled Meds: . azithromycin  250 mg Oral Daily  . butamben-tetracaine-benzocaine  1 spray Topical Once  . famotidine  20 mg Oral BID  . guaiFENesin  600 mg Oral BID  . ipratropium-albuterol  3 mL Nebulization Q6H  . lidocaine  1 application Topical Once  . methylPREDNISolone (SOLU-MEDROL) injection  40 mg Intravenous Daily  . multivitamin with minerals  1 tablet Oral Daily  . nicotine  14 mg Transdermal Daily  . protein supplement shake  11 oz Oral BID BM   Continuous Infusions: . sodium chloride 10 mL/hr at 01/29/17 1039     LOS: 2 days        Minor Iden Gerome Apley, MD [facility] Pager [phone]  If 7PM-7AM, please contact night-coverage [email or web address] Password [email or web address] 01/29/2017, 2:26 PM

## 2017-01-30 ENCOUNTER — Encounter (HOSPITAL_COMMUNITY): Payer: Self-pay | Admitting: Pulmonary Disease

## 2017-01-30 ENCOUNTER — Other Ambulatory Visit: Payer: Self-pay | Admitting: Radiation Oncology

## 2017-01-30 ENCOUNTER — Inpatient Hospital Stay (HOSPITAL_COMMUNITY): Payer: Medicaid Other

## 2017-01-30 ENCOUNTER — Ambulatory Visit
Admit: 2017-01-30 | Discharge: 2017-01-30 | Disposition: A | Discharge status: Home / Self Care | Payer: Self-pay | Attending: Radiation Oncology | Admitting: Radiation Oncology

## 2017-01-30 DIAGNOSIS — R918 Other nonspecific abnormal finding of lung field: Secondary | ICD-10-CM

## 2017-01-30 LAB — CBC WITH DIFFERENTIAL/PLATELET
Basophils Absolute: 0 10*3/uL (ref 0.0–0.1)
Basophils Relative: 0 %
EOS ABS: 0 10*3/uL (ref 0.0–0.7)
EOS PCT: 0 %
HCT: 34.6 % — ABNORMAL LOW (ref 36.0–46.0)
Hemoglobin: 11.3 g/dL — ABNORMAL LOW (ref 12.0–15.0)
LYMPHS ABS: 4.7 10*3/uL — AB (ref 0.7–4.0)
LYMPHS PCT: 23 %
MCH: 25.2 pg — AB (ref 26.0–34.0)
MCHC: 32.7 g/dL (ref 30.0–36.0)
MCV: 77.1 fL — AB (ref 78.0–100.0)
MONO ABS: 1.4 10*3/uL — AB (ref 0.1–1.0)
MONOS PCT: 7 %
Neutro Abs: 14.4 10*3/uL — ABNORMAL HIGH (ref 1.7–7.7)
Neutrophils Relative %: 70 %
PLATELETS: 550 10*3/uL — AB (ref 150–400)
RBC: 4.49 MIL/uL (ref 3.87–5.11)
RDW: 15.4 % (ref 11.5–15.5)
WBC: 20.5 10*3/uL — AB (ref 4.0–10.5)

## 2017-01-30 LAB — ACID FAST SMEAR (AFB): ACID FAST SMEAR - AFSCU2: NEGATIVE

## 2017-01-30 LAB — BASIC METABOLIC PANEL
Anion gap: 9 (ref 5–15)
BUN: 11 mg/dL (ref 6–20)
CO2: 30 mmol/L (ref 22–32)
CREATININE: 0.58 mg/dL (ref 0.44–1.00)
Calcium: 9 mg/dL (ref 8.9–10.3)
Chloride: 98 mmol/L — ABNORMAL LOW (ref 101–111)
GFR calc Af Amer: >60 mL/min (ref >60)
GLUCOSE: 97 mg/dL (ref 65–99)
POTASSIUM: 3.9 mmol/L (ref 3.5–5.1)
Sodium: 137 mmol/L (ref 135–145)

## 2017-01-30 LAB — ACID FAST SMEAR (AFB, MYCOBACTERIA)

## 2017-01-30 MED ORDER — GADOBENATE DIMEGLUMINE 529 MG/ML IV SOLN: 10.0000 mL | Freq: Once | INTRAVENOUS | Status: DC | PRN

## 2017-01-30 MED ORDER — MORPHINE SULFATE (CONCENTRATE) 10 MG/0.5ML PO SOLN: 5.0000 mg | ORAL | Status: DC | PRN

## 2017-01-30 MED ORDER — IOPAMIDOL (ISOVUE-300) INJECTION 61%: 30.0000 mL | Freq: Once | INTRAVENOUS | Status: DC | PRN

## 2017-01-30 MED ORDER — SODIUM CHLORIDE 0.9 % IV SOLN
3.0000 g | Freq: Four times a day (QID) | INTRAVENOUS | Status: DC
  Administered 2017-01-30 – 2017-02-01 (×9): 3 g via INTRAVENOUS
  Filled 2017-01-30 (×10): qty 3

## 2017-01-30 MED ORDER — IPRATROPIUM-ALBUTEROL 0.5-2.5 (3) MG/3ML IN SOLN
3.0000 mL | Freq: Three times a day (TID) | RESPIRATORY_TRACT | Status: DC
  Administered 2017-01-31 – 2017-02-01 (×5): 3 mL via RESPIRATORY_TRACT
  Filled 2017-01-30 (×5): qty 3

## 2017-01-30 NOTE — Progress Notes (Signed)
   Name: Brittany Anthony MRN: [id number] DOB: 1961/06/21    ADMISSION DATE:  01/27/2017 CONSULTATION DATE:  01/28/17  REFERRING MD :  DR Arrien  CHIEF COMPLAINT:  Abnormal CT chest  STUDIES:   BRIEF SUMMARY:  56 yo woman, active smoker (40 pk-yrs), with little PMH except for suspected COPD, GERD. She presented to [facility] with approx 2 weeks of progressive cough and dyspnea, low grade fever, also with blood-tinged sputum for the last week. She has also experienced about 30 lbs unintentional wt loss over thew last year. CT chest from 01/27/17 was personally reviewed, shows an ill defined LUL nodule and a large mediastinal / R hilar mass with extrinsic compression of RUL airways and a possible endobronchial lesion. She was treated for possible AE-COPD and sent to [facility] for further w/u. Of note she had a mildly elevated troponin without ECG changes. Also has been noted to be hyperglycemic (on steroids).    SUBJECTIVE:  Pt concerned about pathology results, anxious to hear back   VITAL SIGNS: Temp:  [98.4 F (36.9 C)-99.5 F (37.5 C)] 98.4 F (36.9 C) (04/04 0431) Pulse Rate:  [102-130] 102 (04/04 0431) Resp:  [15-26] 16 (04/04 0431) BP: (91-157)/(61-108) 122/96 (04/04 0431) SpO2:  [90 %-100 %] 93 % (04/04 0805)  PHYSICAL EXAMINATION: General: thin adult female in NAD HEENT: MM pink/moist PSY: calm/appropriate  Neuro: AAOx4, speech clear, MAE  CV: s1s2 rrr, no m/r/g PULM: even/non-labored, clear on L, diminished on R ON:GEXB, non-tender, bsx4 active  Extremities: warm/dry, no edema  Skin: no rashes or lesions    Recent Labs Lab 01/27/17 1850 01/29/17 0545 01/30/17 0534  NA 134* 140 137  K 3.9 3.8 3.9  CL 101 101 98*  CO2 '26 30 30  '$ BUN '6 12 11  '$ CREATININE 0.49 0.58 0.58  GLUCOSE 184* 108* 97    Recent Labs Lab 01/27/17 1850 01/29/17 0545 01/30/17 0534  HGB 10.3* 11.1* 11.3*  HCT 31.1* 33.9* 34.6*  WBC 9.6 26.8* 20.5*  PLT 529* 561* 550*   No results  found.  ASSESSMENT / PLAN:  Mediastinal Mass -  Mediastinal and right hilar mass encasing airways and filling the perihilar and subcarinal space. There is also an less well defined left upper lobe nodule. There appears to be an endobronchial lesion in the right mainstem bronchus. 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 Bronchoscopy showed endobronchial lesion in BL mainstem, probably arising from subcarinal space, left one was actively bleeding.  Plan: Rad-ONC following, appreciate input Follow up on Cytology  Will likely need PET screening > defer further work up to [employer] Introduced the concept of symptom management to patient / Palliative Care.  She is open to the idea of symptom control as she is greatly concerned about "suffering" during treatment.  Will ask Palliative to see her for symptom control.  She is open to all therapies for her suspected malignancy but is very concerned about side effects.  She was tearful during the conversation.     Probable COPD - without acute exacerbation   Plan: Continue bronchodilators Will need PFT's as an outpatient   Tobacco Abuse  Plan: Smoking cessation counseling   Anxiety   Plan: PRN xanax   Noe Gens, NP-C [facility] Pgr: [phone] or if no answer [phone] 01/30/2017, 10:53 AM

## 2017-01-30 NOTE — Care Management Note (Signed)
Case Management Note  Patient Details  Name: Brittany Anthony MRN: [id number] Date of Birth: 1961/06/04  Subjective/Objective: 56 y/o f admitted w/hemoptysis.Hx: COPD/resp failure. From home.Noted-New AE:[id number] Ca/Lung Ca. Onc/xrt onc/pulmonary following. 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 On 02-will monitor.                 Action/Plan:d/c plan home.   Expected Discharge Date:                  Expected Discharge Plan:  Home/Self Care  In-House Referral:     Discharge planning Services  CM Consult  Post Acute Care Choice:    Choice offered to:     DME Arranged:    DME Agency:     HH Arranged:    HH Agency:     Status of Service:  In process, will continue to follow  If discussed at Long Length of Stay Meetings, dates discussed:    Additional Comments:  Dessa Phi, RN 01/30/2017, 3:17 PM

## 2017-01-30 NOTE — Progress Notes (Addendum)
Chaplain responded to consult.  Patient expresses desiring to visit with her friends who are in the room with her at this time.  Patient expresses that she would love a visit from [place] and she doesn't mean any harm and she states that she is not against religion but she wants to visit with her friends who are there to visit with her.  Chaplain shares with the patient that she understands and will visit with her later.  Patient continues to explain and all laugh.   Chaplain will check back a little later as she can tell the patient does want a visit.    01/30/17 1601  Clinical Encounter Type  Visited With Patient  Visit Type Initial   [name].

## 2017-01-30 NOTE — Progress Notes (Signed)
PROGRESS NOTE    [name]  NFA:[id number] DOB: [birth year] DOA: 01/27/2017 PCP: No primary care provider on file.    Brief Narrative:  56 yo female with tobacco abuse, presents with 2 weeks of progressive dyspnea and productive cough. Worse symptoms over last 3 days, associated with hemoptysis, presented to [facility]. Lung examination with decreased breath sounds. CT chest with changes suggesting bronchogenic carcinoma. Consulted pulmonary for diagnostic bronchoscopy.   Assessment & Plan:   Principal Problem:   Hemoptysis Active Problems:   Lung mass   SOB (shortness of breath)   Acute bronchitis   Respiratory insufficiency   Acute respiratory insufficiency   Hyperglycemia   COPD exacerbation (HCC)   UTI (urinary tract infection)   Protein-calorie malnutrition, severe   COPD exacerbation/acute hypoxic respiratory failure.  She presented to outside hospital with hemoptysis, wheezing, low grade fever, hypoxia She received steroids initially, this is stopped due to wheezing has resolved and she has significant leukocytosis, she received rocephin/zitrho, then zithro alone, she remain has significant leukocytosis, abx change to unasyn to cover possible post obstructive pneumonia,  Culture obtained from bronchoscopy pending, continue nebs, wean oxygen, patient will need to have outpatient PFT. Appreciated pulmonology input  Mediastinal mass, endobronchial lesions ,  CT chest at [facility] positive for abnormal soft tissue mass surrounding the right hilar structures highly suspicious for primary bronchogenic carcinoma. 14 mm nodule on the left upper lobe and 4 mm nodule on the right lower lobe suspicious for metastasis. Bronchial wall thickening.   diagnostic bronchoscopy on 4/3 , endobronchial lesions were brushed and biopsied, cytology pending, culture pending,  Rad onc input appreciated, patient likely will need to have a MRI brain ( rad onc will order after  cytology report), will need outpatient pet imaging, patient plan to find local oncology ( she live one and half hours away from [place]), rad onc will refer patient to local oncologist She has had simulation today on 4/4, plan to start xrt on 4/5, likely able to discharge on 4/6 if she tolerate xrt, and clinically stable, she will need to travel to finish the xrt treatment which is planned for total of 17factions.   Pulmonology and radiation oncology input appreciated    Hemoptysis. hgb stable, continue monitor. Home meds asa stopped.  Tobacco abuser. Continue to encourage aggressive Smoking cessation  Severe malnutrition.  Malnutrition related to catabolic illness as evidenced by 26 percent weight loss in one year, severe depletion of muscle mass, severe depletion of body fat. Nutrition input appreciated, Will continue on nutritional supplements.    Code Status: full  Family Communication: patient   Disposition Plan: home, likely on 4/6 with rad onc and pulmonology clearance   Consultants:  Pulmonology/critical care  Rad onc  Procedures:  Bronchoscope on 4/3   Antibiotics:  Rocephin/zithromax from admission  unasyn from 4/4   Subjective: c/o about anxious about diagnosis, she continued to have hemoptysis, denies chest pain, no wheezing, no edema, no fever,   Objective: Vitals:   01/29/17 1936 01/29/17 2046 01/30/17 0431 01/30/17 0805  BP:  (!) 146/82 (!) 122/96   Pulse: (!) 112 (!) 130 (!) 102   Resp: _0 Temp:  99.5 F (37.5 C) 98.4 F (36.9 C)   TempSrc:  Oral Oral   SpO2: 95% 94% 94% 93%  Weight:      Height:        Intake/Output Summary (Last 24 hours) at 01/30/17 1154 Last data filed at 01/30/17 0600  Gross per 24 hour  Intake            743.5 ml  Output                1 ml  Net            742.5 ml   Filed Weights   01/27/17 1654  Weight: 47.5 kg (104 lb 11.2 oz)    Examination:  General exam: Not in pain or dyspnea, thin,  malnourished E ENT: mild pallor, no icterus, oral mucosa moist  Respiratory system: scattered rales and rhonchi, no wheezing.  Respiratory effort normal. Cardiovascular system: S1 & S2 heard, RRR. No JVD, murmurs, rubs, gallops or clicks. No pedal edema. Gastrointestinal system: Abdomen is nondistended, soft and nontender. No organomegaly or masses felt. Normal bowel sounds heard. Central nervous system: Alert and oriented. No focal neurological deficits. Extremities: Symmetric 5 x 5 power. Skin: No rashes, lesions or ulcers   Data Reviewed: I have personally reviewed following labs and imaging studies  CBC:  Recent Labs Lab 01/27/17 1850 01/29/17 0545 01/30/17 0534  WBC 9.6 26.8* 20.5*  NEUTROABS 8.9* 22.7* 14.4*  HGB 10.3* 11.1* 11.3*  HCT 31.1* 33.9* 34.6*  MCV 73.9* 76.0* 77.1*  PLT 529* 561* 182*   Basic Metabolic Panel:  Recent Labs Lab 01/27/17 1850 01/29/17 0545 01/30/17 0534  NA 134* 140 137  K 3.9 3.8 3.9  CL 101 101 98*  CO2 _0 GLUCOSE 184* 108* 97  BUN _1 CREATININE 0.49 0.58 0.58  CALCIUM 8.6* 9.3 9.0  MG 1.6*  --   --   PHOS 3.3  --   --    GFR: Estimated Creatinine Clearance: 60.3 mL/min (by C-G formula based on SCr of 0.58 mg/dL). Liver Function Tests:  Recent Labs Lab 01/27/17 1850  AST 16  ALT 10*  ALKPHOS 79  BILITOT 0.3  PROT 7.3  ALBUMIN 2.7*   No results for input(s): LIPASE, AMYLASE in the last 168 hours. No results for input(s): AMMONIA in the last 168 hours. Coagulation Profile:  Recent Labs Lab 01/27/17 1850  INR 1.12   Cardiac Enzymes:  Recent Labs Lab 01/27/17 1850 01/28/17 0014 01/28/17 0440  TROPONINI 0.63* 0.46* 0.36*   BNP (last 3 results) No results for input(s): PROBNP in the last 8760 hours. HbA1C:  Recent Labs  01/27/17 1850  HGBA1C 6.0*   CBG:  Recent Labs Lab 01/28/17 0753 01/28/17 1214 01/28/17 1710 01/29/17 0815 01/29/17 2236  GLUCAP 139* 136* 124* 109* 82   Lipid  Profile: No results for input(s): CHOL, HDL, LDLCALC, TRIG, CHOLHDL, LDLDIRECT in the last 72 hours. Thyroid Function Tests:  Recent Labs  01/27/17 1850  TSH 0.153*   Anemia Panel:  Recent Labs  01/27/17 1850  VITAMINB12 276  FOLATE 20.1  FERRITIN 133  TIBC 252  IRON 13*  RETICCTPCT 0.8   Sepsis Labs: No results for input(s): PROCALCITON, LATICACIDVEN in the last 168 hours.  Recent Results (from the past 240 hour(s))  Culture, bal-quantitative     Status: None (Preliminary result)   Collection Time: 01/29/17 11:12 AM  Result Value Ref Range Status   Specimen Description BRONCHIAL ALVEOLAR LAVAGE  Final   Special Requests NONE  Final   Gram Stain   Final    RARE WBC PRESENT,BOTH PMN AND MONONUCLEAR NO ORGANISMS SEEN    Culture   Final    CULTURE REINCUBATED FOR BETTER GROWTH Performed at [facility],  [address] [place],  [address]    Report Status PENDING  Incomplete         Radiology Studies: No results found.      Scheduled Meds: . ampicillin-sulbactam (UNASYN) IV  3 g Intravenous Q6H  . butamben-tetracaine-benzocaine  1 spray Topical Once  . famotidine  20 mg Oral BID  . guaiFENesin  600 mg Oral BID  . ipratropium-albuterol  3 mL Nebulization Q6H  . lidocaine  1 application Topical Once  . multivitamin with minerals  1 tablet Oral Daily  . nicotine  14 mg Transdermal Daily  . protein supplement shake  11 oz Oral BID BM   Continuous Infusions: . sodium chloride 10 mL/hr at 01/29/17 1039     LOS: 3 days     Time >56mns   Lucee Brissett, MD PhD [facility] Pager [phone] If 7PM-7AM, please contact night-coverage [email or web address] Password [email or web address] 01/30/2017, 11:54 AM

## 2017-01-30 NOTE — Consult Note (Signed)
Consultation Note Date: 01/30/2017   Patient Name: Brittany Anthony  DOB: [birth year]  MRN: [id number]  Age / Sex: 56 y.o., female  PCP: No primary care provider on file. Referring Physician: Florencia Reasons, MD  Reason for Consultation: non pain symptom.   HPI/Patient Profile: 56 y.o. female   admitted on 01/27/2017  56 yo female with tobacco abuse, presents with 2 weeks of progressive dyspnea and productive cough. Worse symptoms over last 3 days, associated with hemoptysis, presented to [facility]. Lung examination with decreased breath sounds. CT chest with changes suggesting bronchogenic carcinoma. Consulted pulmonary for diagnostic bronchoscopy. Patient was found to have endobronchial lesion, biopsies pending. MRI brain, PET scan are also being considered, possibly even as an outpatient.   A palliative consult has been placed for symptom management and for supportive care.    Clinical Assessment and Goals of Care:  the patient is a pleasant young lady resting in bed, her husband and friend are present at the bedside. The patient appears anxious, she becomes tearful, she states that she is trying to cope with the news as best as she can. She feels that everything is happening so suddenly. I introduced myself and palliative care as follows: Palliative medicine is specialized medical care for people living with serious illness. It focuses on providing relief from the symptoms and stress of a serious illness. The goal is to improve quality of life for both the patient and the family.  Patient has several questions and concerns related to trying to find transportation for her radiation appointments, finding a local oncologist in [facility]. She is asking for rescue inhalers and supplemental O1 incase she is short of breath at home.   We discussed about adequate symptom management, trying to cope with the news of  her serious illness. She is thankful for the information she has received from the radiation oncology department.   See below, thank you for the consult.   NEXT OF KIN  husband.   SUMMARY OF RECOMMENDATIONS    1. Add low dose Roxanol PO solution PRN for pain/dyspnea.  2. Continue current work up, appreciate radiation oncology follow up.  3. Case management also consulted for assisting the patient with transportation logistics, medical oncologist at [facility] as she lives in [place], [place].   Code Status/Advance Care Planning:  Full code    Symptom Management:    as above   Palliative Prophylaxis:   Bowel Regimen  Additional Recommendations (Limitations, Scope, Preferences):  Full Scope Treatment  Psycho-social/Spiritual:   Desire for further Chaplaincy support:yes  Additional Recommendations: Caregiving  Support/Resources  Prognosis:   Unable to determine  Discharge Planning: Home with Home Health      Primary Diagnoses: Present on Admission: . Acute respiratory insufficiency   I have reviewed the medical record, interviewed the patient and family, and examined the patient. The following aspects are pertinent.  Past Medical History:  Diagnosis Date  . GERD (gastroesophageal reflux disease) 01/27/2017   Social History   Social History  .  Marital status: Unknown    Spouse name: N/A  . Number of children: N/A  . Years of education: N/A   Social History Main Topics  . Smoking status: Current Every Day Smoker    Packs/day: 1.00    Types: Cigarettes    Start date: 10/29/1978  . Smokeless tobacco: Never Used  . Alcohol use No  . Drug use: No  . Sexual activity: Not Currently    Partners: Male    Birth control/ protection: None     Comment: partner is husband    Other Topics Concern  . None   Social History Narrative  . None   Family History  Problem Relation Age of Onset  . Pancreatic cancer Mother   . Lung cancer Maternal  Grandmother    Scheduled Meds: . ampicillin-sulbactam (UNASYN) IV  3 g Intravenous Q6H  . butamben-tetracaine-benzocaine  1 spray Topical Once  . famotidine  20 mg Oral BID  . guaiFENesin  600 mg Oral BID  . ipratropium-albuterol  3 mL Nebulization Q6H  . lidocaine  1 application Topical Once  . multivitamin with minerals  1 tablet Oral Daily  . nicotine  14 mg Transdermal Daily  . protein supplement shake  11 oz Oral BID BM   Continuous Infusions: . sodium chloride 10 mL/hr at 01/29/17 1039   PRN Meds:.acetaminophen, ALPRAZolam, bisacodyl, HYDROcodone-acetaminophen, ipratropium-albuterol, morphine CONCENTRATE, ondansetron **OR** ondansetron (ZOFRAN) IV, phenylephrine Medications Prior to Admission:  Prior to Admission medications   Medication Sig Start Date End Date Taking? Authorizing Provider  acetaminophen (TYLENOL) 325 MG tablet Take 325 mg by mouth every 6 (six) hours as needed for fever.   Yes Historical Provider, MD  aspirin 325 MG tablet Take 325 mg by mouth daily.   Yes Historical Provider, MD  guaiFENesin (MUCINEX) 600 MG 12 hr tablet Take 600 mg by mouth 2 (two) times daily.   Yes Historical Provider, MD   No Known Allergies Review of Systems Occasional dyspnea, anxiety  Physical Exam Thin weak appearing young lady resting in bed Scattered rhonchi S1 S2 Abdomen soft No edema Thin extremities Non focal  Vital Signs: BP 114/80   Pulse (!) 122   Temp 98.2 F (36.8 C) (Oral)   Resp 18   Ht '5\' 7"'$  (1.702 m)   Wt 47.5 kg (104 lb 11.2 oz)   SpO2 90%   BMI 16.40 kg/m  Pain Assessment: 0-10 POSS *See Group Information*: 1-Acceptable,Awake and alert Pain Score: 2    SpO2: SpO2: 90 % O2 Device:SpO2: 90 % O2 Flow Rate: .O2 Flow Rate (L/min): 2 L/min  IO: Intake/output summary:  Intake/Output Summary (Last 24 hours) at 01/30/17 1629 Last data filed at 01/30/17 1614  Gross per 24 hour  Intake              800 ml  Output               25 ml  Net               775 ml    LBM: Last BM Date: 01/26/17 Baseline Weight: Weight: 47.5 kg (104 lb 11.2 oz) Most recent weight: Weight: 47.5 kg (104 lb 11.2 oz)     Palliative Assessment/Data:   Flowsheet Rows     Most Recent Value  Intake Tab  Referral Department  Hospitalist  Unit at Time of Referral  Med/Surg Unit  Palliative Care Primary Diagnosis  Cancer  Palliative Care Type  New Palliative care  Reason for  referral  Non-pain Symptom, Clarify Goals of Care  Date first seen by Palliative Care  01/30/17  Clinical Assessment  Palliative Performance Scale Score  40%  Pain Max last 24 hours  4  Pain Min Last 24 hours  3  Dyspnea Max Last 24 Hours  6  Dyspnea Min Last 24 hours  5  Psychosocial & Spiritual Assessment  Palliative Care Outcomes  Patient/Family meeting held?  Yes  Who was at the meeting?  patient, husband.   Palliative Care Outcomes  Clarified goals of care, Improved non-pain symptom therapy      Time In:  1400 Time Out:  1500 Time Total:  60 min  Greater than 50%  of this time was spent counseling and coordinating care related to the above assessment and plan.  Signed by: Loistine Chance, MD  [phone]  Please contact Palliative Medicine Team phone at [phone] for questions and concerns.  For individual provider: See Shea Evans

## 2017-01-30 NOTE — Consult Note (Signed)
   Name: Brittany Anthony MRN: [id number] DOB: July 13, 1961    ADMISSION DATE:  01/27/2017 CONSULTATION DATE:  01/28/17  REFERRING MD :  DR Arrien  CHIEF COMPLAINT:  Abnormal CT chest  STUDIES:   HISTORY OF PRESENT ILLNESS:  56 yo woman, active smoker (40 pk-yrs), with little PMH except for suspected COPD, GERD. She presented to [facility] with approx 2 weeks of progressive cough and dyspnea, low grade fever, also with blood-tinged sputum for the last week. She has also experienced about 30 lbs unintentional wt loss over thew last year. CT chest from 01/27/17 was personally reviewed, shows an ill defined LUL nodule and a large mediastinal / R hilar mass with extrinsic compression of RUL airways and a possible endobronchial lesion. She was treated for possible AE-COPD and sent to [facility] for further w/u. Of note she had a mildly elevated troponin without ECG changes. Also has been noted to be hyperglycemic (on steroids).    SUBJECTIVE: c/o hemoptysis Afebrile   VITAL SIGNS: Temp:  [98.4 F (36.9 C)-99.5 F (37.5 C)] 98.4 F (36.9 C) (04/04 0431) Pulse Rate:  [102-130] 102 (04/04 0431) Resp:  [12-26] 16 (04/04 0431) BP: (91-157)/(61-108) 122/96 (04/04 0431) SpO2:  [90 %-100 %] 93 % (04/04 0805)  PHYSICAL EXAMINATION: General:  Thin woman, NAD on RA, anxious affectNeuro:  Awake, alert, non-focal HEENT:  Op clear, no lesions Cardiovascular:  Regular, no M Lungs:  Bilateral soft expiratory rhonchi, right midlung inspiratory squeak Abdomen:  Benign Musculoskeletal:  No deformity Skin:  No rash   Recent Labs Lab 01/27/17 1850 01/29/17 0545 01/30/17 0534  NA 134* 140 137  K 3.9 3.8 3.9  CL 101 101 98*  CO2 '26 30 30  '$ BUN '6 12 11  '$ CREATININE 0.49 0.58 0.58  GLUCOSE 184* 108* 97    Recent Labs Lab 01/27/17 1850 01/29/17 0545 01/30/17 0534  HGB 10.3* 11.1* 11.3*  HCT 31.1* 33.9* 34.6*  WBC 9.6 26.8* 20.5*  PLT 529* 561* 550*   No results found.  ASSESSMENT / PLAN:  Abnormal CT  scan of the chest. Mediastinal and right hilar mass encasing airways and filling the perihilar and subcarinal space. There is also an less well deformed left upper lobe nodule. There appears to be an endobronchial lesion in the right mainstem bronchus. 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 Bronchoscopy showed endobronchial lesion in BL mainsem, probably arising from subcarinal space, left one was actively bleeding I called Rad Onc to consult  Probable COPD. no clear evidence of an acute exacerbation on exam. Suspect that her symptoms are due to her hilar and mediastinal mass. dc steroids. She will need formal PFT in the future. Ct BD'.  Tobacco Abuse. Smoking cessation emphasized   Rigoberto Noel. MD   01/30/2017, 9:03 AM

## 2017-01-30 NOTE — Progress Notes (Addendum)
I spoke with Dr. 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 We will proceed with brain MRI and CT abdomen and pelvis as well. I also spoke with Dr. Hinton Rao in Medical Oncology to review her case. We will plan outpatient referral to [facility] and I will contact Dr. Orlene Erm to coordinate additional  radiation treatments.    Carola Rhine, PAC

## 2017-01-30 NOTE — Progress Notes (Signed)
Chaplain stopped back in to visit with patient to respond to consult.  Patient's friends have left however the patient is on the phone now and says to the Chaplain, "we will have to talk another time".  Chaplain said sure, no problem and left the patient to her phone call.      01/30/17 1701  Clinical Encounter Type  Visited With Patient   Theodoro Doing

## 2017-01-30 NOTE — Progress Notes (Signed)
  Radiation Oncology         [phone] ________________________________  Name: Brittany Anthony MRN: [id number]  Date: 01/30/2017  DOB: 21-Jan-1961  SIMULATION AND TREATMENT PLANNING NOTE    ICD-9-CM ICD-10-CM   1. 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  Identity was confirmed.  All relevant records and images related to the planned course of therapy were reviewed.  The patient freely provided informed written consent to proceed with treatment after reviewing the details related to the planned course of therapy. The consent form was witnessed and verified by the simulation staff.  Then, the patient was set-up in a stable reproducible  supine position for radiation therapy.  CT images were obtained.  Surface markings were placed.  The CT images were loaded into the planning software.  Then the target and avoidance structures were contoured.  Treatment planning then occurred.  The radiation prescription was entered and confirmed.  Then, I designed and supervised the construction of a total of 3 medically necessary complex treatment devices.  I have requested : 3D Simulation  I have requested a DVH of the following structures: GTV, PTV, lungs, heart, spinal cord.  I have ordered:dose calc.  PLAN:  The patient will receive 6 Gy in 2 fractions. Patient will then be discharged and continue radiation treatment at the [facility] in [place] which is closer to the patient's home. The patient will proceed with a CT scan of the abdomen and pelvis prior to discharge to assist in further staging evaluation.  She will be seen at the [facility] early next week to plan her more definitive course of treatment.    -----------------------------------  Blair Promise, PhD, MD

## 2017-01-31 ENCOUNTER — Other Ambulatory Visit: Payer: Self-pay | Admitting: Radiation Oncology

## 2017-01-31 ENCOUNTER — Inpatient Hospital Stay (HOSPITAL_COMMUNITY): Payer: Medicaid Other

## 2017-01-31 ENCOUNTER — Ambulatory Visit
Admit: 2017-01-31 | Discharge: 2017-01-31 | Disposition: A | Discharge status: Home / Self Care | Payer: Self-pay | Attending: Radiation Oncology | Admitting: Radiation Oncology

## 2017-01-31 DIAGNOSIS — C3412 Malignant neoplasm of upper lobe, left bronchus or lung: Secondary | ICD-10-CM

## 2017-01-31 LAB — GLUCOSE, CAPILLARY
GLUCOSE-CAPILLARY: 94 mg/dL (ref 65–99)
GLUCOSE-CAPILLARY: 102 mg/dL — AB (ref 65–99)
GLUCOSE-CAPILLARY: 127 mg/dL — AB (ref 65–99)
Glucose-Capillary: 91 mg/dL (ref 65–99)

## 2017-01-31 MED ORDER — BOOST PLUS PO LIQD
237.0000 mL | Freq: Once | ORAL | Status: AC
  Administered 2017-01-31: 237 mL via ORAL
  Filled 2017-01-31: qty 237

## 2017-01-31 MED ORDER — IOPAMIDOL (ISOVUE-300) INJECTION 61%
INTRAVENOUS | Status: AC
  Filled 2017-01-31: qty 100

## 2017-01-31 MED ORDER — GADOBENATE DIMEGLUMINE 529 MG/ML IV SOLN
10.0000 mL | Freq: Once | INTRAVENOUS | Status: AC | PRN
  Administered 2017-01-30: 10 mL via INTRAVENOUS

## 2017-01-31 MED ORDER — IOPAMIDOL (ISOVUE-300) INJECTION 61%
INTRAVENOUS | Status: AC
  Filled 2017-01-31: qty 30

## 2017-01-31 MED ORDER — IOPAMIDOL (ISOVUE-300) INJECTION 61%
100.0000 mL | Freq: Once | INTRAVENOUS | Status: AC | PRN
  Administered 2017-01-31: 100 mL via INTRAVENOUS

## 2017-01-31 MED ORDER — IOPAMIDOL (ISOVUE-300) INJECTION 61%: 15.0000 mL | Freq: Once | INTRAVENOUS | Status: DC | PRN

## 2017-01-31 NOTE — Care Management Note (Signed)
Case Management Note  Patient Details  Name: Brittany Anthony MRN: [id number] Date of Birth: 1960-11-29  Subjective/Objective: Noted-ordered for neb machine-[facility] rep Maudie Mercury will check if qualifies for charity. [employer] on [employer] med list-patient can afford. Will monitor if home 02 needed.                   Action/Plan:d/c plan home.   Expected Discharge Date:                  Expected Discharge Plan:  Home/Self Care  In-House Referral:     Discharge planning Services  CM Consult, [facility], Medication Assistance  Post Acute Care Choice:    Choice offered to:     DME Arranged:    DME Agency:     HH Arranged:    HH Agency:     Status of Service:  In process, will continue to follow  If discussed at Long Length of Stay Meetings, dates discussed:    Additional Comments:  Dessa Phi, RN 01/31/2017, 1:02 PM

## 2017-01-31 NOTE — Progress Notes (Signed)
Name: Brittany Anthony MRN: [id number] DOB: 1960/12/21    ADMISSION DATE:  01/27/2017 CONSULTATION DATE:  01/28/17  REFERRING MD :  DR Arrien  CHIEF COMPLAINT:  Abnormal CT chest  STUDIES:   BRIEF SUMMARY:  56 yo woman, active smoker (40 pk-yrs), with little PMH except for suspected COPD, GERD. She presented to [facility] with approx 2 weeks of progressive cough and dyspnea, low grade fever, also with blood-tinged sputum for the last week. She has also experienced about 30 lbs unintentional wt loss over thew last year. CT chest from 01/27/17 was personally reviewed, shows an ill defined LUL nodule and a large mediastinal / R hilar mass with extrinsic compression of RUL airways and a possible endobronchial lesion. She was treated for possible AE-COPD and sent to [facility] for further w/u. Of note she had a mildly elevated troponin without ECG changes. Also has been noted to be hyperglycemic (on steroids).    SUBJECTIVE:   Pt reports feeling "as good as I can, my mind is going in circles"  VITAL SIGNS: Temp:  [97.9 F (36.6 C)-99 F (37.2 C)] 97.9 F (36.6 C) (04/05 0520) Pulse Rate:  [94-124] 94 (04/05 0520) Resp:  [16-20] 16 (04/05 0520) BP: (97-116)/(77-80) 116/79 (04/05 0520) SpO2:  [90 %-97 %] 97 % (04/05 0919)  PHYSICAL EXAMINATION: General:  Thin adult female in NAD HEENT: MM pink/moist, no PND PSY: tearful at times, appropriate Neuro: AAOx4, speech clear, MAE CV: s1s2 rrr, no m/r/g PULM: even/non-labored, diminished RLL, faint wheeze on left ZT:IWPY, non-tender, bsx4 active  Extremities: warm/dry, no edema  Skin: no rashes or lesions     Recent Labs Lab 01/27/17 1850 01/29/17 0545 01/30/17 0534  NA 134* 140 137  K 3.9 3.8 3.9  CL 101 101 98*  CO2 '26 30 30  '$ BUN '6 12 11  '$ CREATININE 0.49 0.58 0.58  GLUCOSE 184* 108* 97    Recent Labs Lab 01/27/17 1850 01/29/17 0545 01/30/17 0534  HGB 10.3* 11.1* 11.3*  HCT 31.1* 33.9* 34.6*  WBC 9.6 26.8* 20.5*  PLT 529* 561*  550*   Mr Brain W Wo Contrast  Result Date: 01/30/2017 CLINICAL DATA:  New diagnosis lung cancer, here for staging. EXAM: MRI HEAD WITHOUT AND WITH CONTRAST TECHNIQUE: Multiplanar, multiecho pulse sequences of the brain and surrounding structures were obtained without and with intravenous contrast. CONTRAST:  83m MULTIHANCE GADOBENATE DIMEGLUMINE 529 MG/ML IV SOLN COMPARISON:  None. FINDINGS: INTRACRANIAL CONTENTS: No reduced diffusion to suggest acute ischemia. No susceptibility artifact to suggest hemorrhage. The ventricles and sulci are normal for patient's age. Scattered supratentorial subcentimeter white matter FLAIR T2 hyperintensities. No suspicious parenchymal signal, masses, mass effect. No abnormal intraparenchymal or extra-axial enhancement. No abnormal extra-axial fluid collections. No extra-axial masses. VASCULAR: Normal major intracranial vascular flow voids present at skull base. SKULL AND UPPER CERVICAL SPINE: Partially empty sella. No suspicious calvarial bone marrow signal. Craniocervical junction maintained. SINUSES/ORBITS: The mastoid air-cells and included paranasal sinuses are well-aerated.The included ocular globes and orbital contents are non-suspicious. OTHER: None. IMPRESSION: No intracranial metastasis nor acute intracranial process. Mild chronic small vessel ischemic disease. Electronically Signed   By: CElon AlasM.D.   On: 01/30/2017 21:50   PATHOLOGY: FOB Bronchial Brushing 4/3 >> non-small cell carcinoma  Endobronchial biopsy 4/3 >> atypical clusters highly suspicious for squamous cell carcinoma    ASSESSMENT / PLAN:  Mediastinal Mass / Non-Small Cell Carcinoma -  Mediastinal and right hilar mass encasing airways and filling the perihilar and subcarinal space. There  is also an less well defined left upper lobe nodule. There appears to be an endobronchial lesion in the right mainstem bronchus. Findings are very consistent with a primary lung malignancy, probably  small cell lung cancer. Bronchoscopy showed endobronchial lesion in BL mainstem, probably arising from subcarinal space, left one was actively bleeding.  Plan: Follow up with RAD-ONC in [place]  Defer further work up to [place] input  Pt will need home nebulizer, PRN albuterol for discharge Assess ambulatory O2 needs prior to discharge PRN liquid morphine > will need for discharge for pain / SOB   Probable COPD - without acute exacerbation   Plan: Continue bronchodilators  Will need PFT's as outpatient    Tobacco Abuse  Plan: Smoking cessation counseling    Anxiety   Plan: PRN xanax > recommend Rx for discharge  PCCM will be available PRN.  Please call back if new needs arise.   Noe Gens, NP-C [facility] Pgr: [phone] or if no answer [phone] 01/31/2017, 9:47 AM

## 2017-01-31 NOTE — Progress Notes (Signed)
SATURATION QUALIFICATIONS: (This note is used to comply with regulatory documentation for home oxygen)  Patient Saturations on Room Air at Rest = 96%  Patient Saturations on Room Air while Ambulating = 96%

## 2017-01-31 NOTE — Progress Notes (Signed)
Nutrition Follow-up  DOCUMENTATION CODES:   Severe malnutrition in context of chronic illness  INTERVENTION:  - Continue Premier Protein BID. - Will order Magic Cup with dinner each day, each supplement provides 290 kcal and 9 grams of protein - Will order Boost Plus x1 today for pt to try. This will provide 360 kcal and 14 grams of protein. - Continue to encourage PO intakes of meals and supplements. - RD will continue to monitor for needs.   NUTRITION DIAGNOSIS:   Malnutrition related to catabolic illness as evidenced by percent weight loss, severe depletion of muscle mass, severe depletion of body fat. -ongoing  GOAL:   Patient will meet greater than or equal to 90% of their needs -met on average   MONITOR:   PO intake, Supplement acceptance, Labs, Weight trends  ASSESSMENT:   56 y.o. female with no significant medical history except GERD and 40 pyhx of tobacco abuse presenting with 2 weeks history of progressing worsening sob and cough productive bloody sputum associated with low grade fever without chills. Noted to have COPD exacerbation and lung mass  4/5 Pt has had periods of NPO status. Per review, she consumed 100% of late lunch 4/2 and 100% of dinner 4/3. Visualized lunch tray from today with 100% completion. Pt states that since admission she has been feeling hungry and that she has been eating all/nearly all of ordered meals. She has also been focusing on protein and ordering items such as scrambled eggs for breakfast and hamburgers. She has been drinking Premier Protein at least once/day although she is not crazy about this supplement; she states knowing that it is beneficial for her. Talked with pt about available flavors to try after d/c and also other oral nutrition supplement brands that she may be interested in trying. Pt would like to try Boost Plus during admission so will order x1 for tonight.  She reports that during previous RD visit, Magic Cup was discussed but  that she has not received this supplement. She has been ordering regular ice cream at dinner. Pt states that after taking multivitamin each day she has the taste of vitamins and minerals in her mouth. Pt is interested in trying chewable multivitamin after d/c.  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 Pt went to simulation lab yesterday evening. Plan for outpatient radiation; updated estimated nutrition needs based on these findings and plan. Also s/p MRI. No new weight since admission.  Medications reviewed; 20 mg oral Pepcid BID, daily multivitamin with minerals.  Labs reviewed; Cl: 98 mmol/L.     4/2 - Pt reports poor appetite for the past year.  - Per chart, pt has lost 36lbs(26%) over the past year; this is severe.  - Pt hungry today and wanting to eat.  - Pt NPO this morning for possible biopsy; now advanced to regular diet.  - Pt noted to have lung mass on admit.  - Plan is for pt to have lung biopsy possibly tomorrow.  - RD discussed with pt the importance of adequate protein intake to preserve lean muscle mass. - Pt would like to try Premier Protein; RD will order.     Diet Order:  Diet regular Room service appropriate? Yes; Fluid consistency: Thin  Skin:  Reviewed, no issues  Last BM:  PTA/unknown  Height:   Ht Readings from Last 1 Encounters:  01/27/17 5' 7"  (1.702 m)    Weight:   Wt Readings from Last 1 Encounters:  01/27/17 104 lb  11.2 oz (47.5 kg)    Ideal Body Weight:  61.3 kg  BMI:  Body mass index is 16.4 kg/m.  Estimated Nutritional Needs:   Kcal:  1665-1900 (35-40 kcal/kg)  Protein:  80-95 grams (1.7-2 grams/kg)  Fluid:  1.8-2 L/day  EDUCATION NEEDS:   No education needs identified at this time    Jarome Matin, MS, RD, LDN, CNSC Inpatient Clinical Dietitian Pager # [phone] After hours/weekend pager # [phone]

## 2017-01-31 NOTE — Progress Notes (Signed)
I called to let the patient know her brain MRI was negative and the rationale for her CT imaging. She will follow up as an outpt with Dr. Orlene Erm in [place] at [facility], and Dr. Hinton Rao in [place] at [place] as well.

## 2017-01-31 NOTE — Progress Notes (Addendum)
PROGRESS NOTE    Brittany Anthony  DDU:[id number] DOB: 1961-07-16 DOA: 01/27/2017 PCP: No primary care provider on file.    Brief Narrative:  56 yo female with tobacco abuse, presents with 2 weeks of progressive dyspnea and productive cough. Worse symptoms over last 3 days, associated with hemoptysis, presented to [facility]. Lung examination with decreased breath sounds. CT chest with changes suggesting bronchogenic carcinoma. Consulted pulmonary for diagnostic bronchoscopy.   CTA chest on 4/1 from outside hospital: 1. No evidence of a pulmonary embolus. 2. Abnormal soft tissue in the mediastinum extending to surround the right hilar structures highly suspicious for a primary bronchogenic carcinoma. There is associated mediastinal and left hilar adenopathy. 3. 14 mm nodule in the left upper lobe and 4 mm nodule in the right lower lobe suspicious for metastatic disease. 4. Significant bilateral, predominantly lower lung, bronchial wall<BR>thickening with bronchial secretions.  Assessment & Plan:   Principal Problem:   Hemoptysis Active Problems:   Lung mass   SOB (shortness of breath)   Acute bronchitis   Respiratory insufficiency   Acute respiratory insufficiency   Hyperglycemia   COPD exacerbation (HCC)   UTI (urinary tract infection)   Protein-calorie malnutrition, severe   COPD exacerbation/acute hypoxic respiratory failure.  She presented to outside hospital with hemoptysis, wheezing, low grade fever, hypoxia She received steroids initially, this is stopped due to wheezing has resolved and she has significant leukocytosis, she received rocephin/zitrho, then zithro alone, she remain has significant leukocytosis, abx change to unasyn to cover possible post obstructive pneumonia,  Culture obtained from bronchoscopy pending, continue nebs, wean oxygen, patient will need to have outpatient PFT. Appreciated pulmonology input  Mediastinal mass, endobronchial lesions  CT  chest at [facility] positive for abnormal soft tissue mass surrounding the right hilar structures highly suspicious for primary bronchogenic carcinoma. 14 mm nodule on the left upper lobe and 4 mm nodule on the right lower lobe suspicious for metastasis. Bronchial wall thickening.   diagnostic bronchoscopy on 4/3 , endobronchial lesions were brushed and biopsied, cytology pending, culture pending,  Rad onc input appreciated, patient likely will need to have a MRI brain ( rad onc will order after cytology report), will need outpatient pet imaging, patient plan to find local oncology ( she live one and half hours away from [place]), rad onc will refer patient to local oncologist She has had simulation today on 4/4, plan to start xrt on 4/5, likely able to discharge on 4/6 if she tolerate xrt, and clinically stable, she will need to travel to finish the xrt treatment which is planned for total of 27factions.   Pulmonology and radiation oncology input appreciated    Hemoptysis. hgb stable, continue monitor. Home meds asa stopped.  Tobacco abuser. Continue to encourage aggressive Smoking cessation  Severe malnutrition.  Malnutrition related to catabolic illness as evidenced by 26 percent weight loss in one year, severe depletion of muscle mass, severe depletion of body fat. Nutrition input appreciated, Will continue on nutritional supplements.    Code Status: full  Family Communication: patient   Disposition Plan: home, likely on 4/6 with rad onc and pulmonology clearance   Consultants:  Pulmonology/critical care  Rad onc  Procedures:  Bronchoscope on 4/3   Antibiotics:  Rocephin/zithromax from admission  unasyn from 4/4   Subjective: She is happy that her mri brain is negative for mets she denies chest pain, no wheezing, no edema, no fever,   Objective: Vitals:   01/30/17 1737 01/30/17 1941 01/30/17 2118 01/31/17  0520  BP:   97/77 116/79  Pulse:  (!) 121 (!)  124 94  Resp:  _0 Temp:   99 F (37.2 C) 97.9 F (36.6 C)  TempSrc:   Oral Oral  SpO2: 92% 91% 90% 92%  Weight:      Height:        Intake/Output Summary (Last 24 hours) at 01/31/17 0754 Last data filed at 01/31/17 0600  Gross per 24 hour  Intake              300 ml  Output               25 ml  Net              275 ml   Filed Weights   01/27/17 1654  Weight: 47.5 kg (104 lb 11.2 oz)    Examination:  General exam: Not in pain or dyspnea, thin, malnourished E ENT: mild pallor, no icterus, oral mucosa moist  Respiratory system: scattered rales and rhonchi, no wheezing.  Respiratory effort normal. Cardiovascular system: S1 & S2 heard, RRR. No JVD, murmurs, rubs, gallops or clicks. No pedal edema. Gastrointestinal system: Abdomen is nondistended, soft and nontender. No organomegaly or masses felt. Normal bowel sounds heard. Central nervous system: Alert and oriented. No focal neurological deficits. Extremities: Symmetric 5 x 5 power. Skin: No rashes, lesions or ulcers   Data Reviewed: I have personally reviewed following labs and imaging studies  CBC:  Recent Labs Lab 01/27/17 1850 01/29/17 0545 01/30/17 0534  WBC 9.6 26.8* 20.5*  NEUTROABS 8.9* 22.7* 14.4*  HGB 10.3* 11.1* 11.3*  HCT 31.1* 33.9* 34.6*  MCV 73.9* 76.0* 77.1*  PLT 529* 561* 250*   Basic Metabolic Panel:  Recent Labs Lab 01/27/17 1850 01/29/17 0545 01/30/17 0534  NA 134* 140 137  K 3.9 3.8 3.9  CL 101 101 98*  CO2 _1 GLUCOSE 184* 108* 97  BUN _2 CREATININE 0.49 0.58 0.58  CALCIUM 8.6* 9.3 9.0  MG 1.6*  --   --   PHOS 3.3  --   --    GFR: Estimated Creatinine Clearance: 60.3 mL/min (by C-G formula based on SCr of 0.58 mg/dL). Liver Function Tests:  Recent Labs Lab 01/27/17 1850  AST 16  ALT 10*  ALKPHOS 79  BILITOT 0.3  PROT 7.3  ALBUMIN 2.7*   No results for input(s): LIPASE, AMYLASE in the last 168 hours. No results for input(s): AMMONIA in the last 168  hours. Coagulation Profile:  Recent Labs Lab 01/27/17 1850  INR 1.12   Cardiac Enzymes:  Recent Labs Lab 01/27/17 1850 01/28/17 0014 01/28/17 0440  TROPONINI 0.63* 0.46* 0.36*   BNP (last 3 results) No results for input(s): PROBNP in the last 8760 hours. HbA1C: No results for input(s): HGBA1C in the last 72 hours. CBG:  Recent Labs Lab 01/28/17 1214 01/28/17 1710 01/29/17 0815 01/29/17 2236 01/31/17 0747  GLUCAP 136* 124* 109* 82 91   Lipid Profile: No results for input(s): CHOL, HDL, LDLCALC, TRIG, CHOLHDL, LDLDIRECT in the last 72 hours. Thyroid Function Tests: No results for input(s): TSH, T4TOTAL, FREET4, T3FREE, THYROIDAB in the last 72 hours. Anemia Panel: No results for input(s): VITAMINB12, FOLATE, FERRITIN, TIBC, IRON, RETICCTPCT in the last 72 hours. Sepsis Labs: No results for input(s): PROCALCITON, LATICACIDVEN in the last 168 hours.  Recent Results (from the past 240 hour(s))  Culture, bal-quantitative     Status: None (Preliminary result)  Collection Time: 01/29/17 11:12 AM  Result Value Ref Range Status   Specimen Description BRONCHIAL ALVEOLAR LAVAGE  Final   Special Requests NONE  Final   Gram Stain   Final    RARE WBC PRESENT,BOTH PMN AND MONONUCLEAR NO ORGANISMS SEEN    Culture   Final    CULTURE REINCUBATED FOR BETTER GROWTH Performed at [facility], [place] [address] [place], [place] [address]    Report Status PENDING  Incomplete  Acid Fast Smear (AFB)     Status: None   Collection Time: 01/29/17 11:12 AM  Result Value Ref Range Status   AFB Specimen Processing Concentration  Final   Acid Fast Smear Negative  Final    Comment: (NOTE) Performed At: [facility] [place], [place] [id number] Lindon Romp MD QQ:[id number]    Source (AFB) BRONCHIAL ALVEOLAR LAVAGE  Final         Radiology Studies: Mr Jeri Cos Wo Contrast  Result Date: 01/30/2017 CLINICAL DATA:  New diagnosis lung cancer, here for  staging. EXAM: MRI HEAD WITHOUT AND WITH CONTRAST TECHNIQUE: Multiplanar, multiecho pulse sequences of the brain and surrounding structures were obtained without and with intravenous contrast. CONTRAST:  33m MULTIHANCE GADOBENATE DIMEGLUMINE 529 MG/ML IV SOLN COMPARISON:  None. FINDINGS: INTRACRANIAL CONTENTS: No reduced diffusion to suggest acute ischemia. No susceptibility artifact to suggest hemorrhage. The ventricles and sulci are normal for patient's age. Scattered supratentorial subcentimeter white matter FLAIR T2 hyperintensities. No suspicious parenchymal signal, masses, mass effect. No abnormal intraparenchymal or extra-axial enhancement. No abnormal extra-axial fluid collections. No extra-axial masses. VASCULAR: Normal major intracranial vascular flow voids present at skull base. SKULL AND UPPER CERVICAL SPINE: Partially empty sella. No suspicious calvarial bone marrow signal. Craniocervical junction maintained. SINUSES/ORBITS: The mastoid air-cells and included paranasal sinuses are well-aerated.The included ocular globes and orbital contents are non-suspicious. OTHER: None. IMPRESSION: No intracranial metastasis nor acute intracranial process. Mild chronic small vessel ischemic disease. Electronically Signed   By: CElon AlasM.D.   On: 01/30/2017 21:50        Scheduled Meds: . ampicillin-sulbactam (UNASYN) IV  3 g Intravenous Q6H  . butamben-tetracaine-benzocaine  1 spray Topical Once  . famotidine  20 mg Oral BID  . guaiFENesin  600 mg Oral BID  . ipratropium-albuterol  3 mL Nebulization TID  . lidocaine  1 application Topical Once  . multivitamin with minerals  1 tablet Oral Daily  . nicotine  14 mg Transdermal Daily  . protein supplement shake  11 oz Oral BID BM   Continuous Infusions: . sodium chloride 10 mL/hr at 01/29/17 1039     LOS: 4 days     Time 271ms   Brittany Whitmire, MD PhD [facility] Pager [phone] 7PM-7AM, please contact  night-coverage [email or web address] Password [email or web address]/02/2017, 7:54 AM

## 2017-02-01 ENCOUNTER — Encounter: Payer: Self-pay | Admitting: Radiation Oncology

## 2017-02-01 ENCOUNTER — Ambulatory Visit: Payer: Self-pay

## 2017-02-01 ENCOUNTER — Inpatient Hospital Stay
Admit: 2017-02-01 | Discharge: 2017-02-01 | Disposition: A | Discharge status: Home / Self Care | Payer: Self-pay | Attending: Radiation Oncology | Admitting: Radiation Oncology

## 2017-02-01 ENCOUNTER — Ambulatory Visit
Admit: 2017-02-01 | Discharge: 2017-02-01 | Disposition: A | Discharge status: Home / Self Care | Payer: Self-pay | Attending: Radiation Oncology | Admitting: Radiation Oncology

## 2017-02-01 VITALS — BP 103/80 | HR 128 | Resp 20 | Wt 106.2 lb

## 2017-02-01 DIAGNOSIS — R918 Other nonspecific abnormal finding of lung field: Secondary | ICD-10-CM

## 2017-02-01 LAB — GLUCOSE, CAPILLARY
GLUCOSE-CAPILLARY: 101 mg/dL — AB (ref 65–99)
Glucose-Capillary: 103 mg/dL — ABNORMAL HIGH (ref 65–99)

## 2017-02-01 LAB — BASIC METABOLIC PANEL
ANION GAP: 8 (ref 5–15)
BUN: 11 mg/dL (ref 6–20)
CALCIUM: 8.5 mg/dL — AB (ref 8.9–10.3)
CO2: 28 mmol/L (ref 22–32)
Chloride: 95 mmol/L — ABNORMAL LOW (ref 101–111)
Creatinine, Ser: 0.62 mg/dL (ref 0.44–1.00)
Glucose, Bld: 114 mg/dL — ABNORMAL HIGH (ref 65–99)
Potassium: 4.1 mmol/L (ref 3.5–5.1)
Sodium: 131 mmol/L — ABNORMAL LOW (ref 135–145)

## 2017-02-01 LAB — CULTURE, BAL-QUANTITATIVE: CULTURE: NORMAL

## 2017-02-01 LAB — CBC WITH DIFFERENTIAL/PLATELET
BASOS ABS: 0 10*3/uL (ref 0.0–0.1)
BASOS PCT: 0 %
EOS PCT: 1 %
Eosinophils Absolute: 0.1 10*3/uL (ref 0.0–0.7)
HCT: 35 % — ABNORMAL LOW (ref 36.0–46.0)
Hemoglobin: 11.3 g/dL — ABNORMAL LOW (ref 12.0–15.0)
Lymphocytes Relative: 17 %
Lymphs Abs: 1.9 10*3/uL (ref 0.7–4.0)
MCH: 24.4 pg — ABNORMAL LOW (ref 26.0–34.0)
MCHC: 32.3 g/dL (ref 30.0–36.0)
MCV: 75.4 fL — ABNORMAL LOW (ref 78.0–100.0)
MONO ABS: 1 10*3/uL (ref 0.1–1.0)
Monocytes Relative: 9 %
Neutro Abs: 8 10*3/uL — ABNORMAL HIGH (ref 1.7–7.7)
Neutrophils Relative %: 73 %
PLATELETS: 553 10*3/uL — AB (ref 150–400)
RBC: 4.64 MIL/uL (ref 3.87–5.11)
RDW: 15.2 % (ref 11.5–15.5)
WBC: 11 10*3/uL — AB (ref 4.0–10.5)

## 2017-02-01 LAB — MAGNESIUM: MAGNESIUM: 2.1 mg/dL (ref 1.7–2.4)

## 2017-02-01 MED ORDER — HYDROCODONE-ACETAMINOPHEN 5-325 MG PO TABS: 1.0000 | ORAL_TABLET | Freq: Four times a day (QID) | ORAL | 0 refills | Status: AC | PRN

## 2017-02-01 MED ORDER — FAMOTIDINE 20 MG PO TABS: 20.0000 mg | ORAL_TABLET | Freq: Every day | ORAL | 0 refills | Status: AC

## 2017-02-01 MED ORDER — FAMOTIDINE 20 MG PO TABS: 20.0000 mg | ORAL_TABLET | Freq: Every day | ORAL | 0 refills | Status: DC

## 2017-02-01 MED ORDER — ALBUTEROL SULFATE HFA 108 (90 BASE) MCG/ACT IN AERS: 2.0000 | INHALATION_SPRAY | Freq: Four times a day (QID) | RESPIRATORY_TRACT | 0 refills | Status: AC | PRN

## 2017-02-01 MED ORDER — PREMIER PROTEIN SHAKE: 11.0000 [oz_av] | Freq: Two times a day (BID) | ORAL | 0 refills | Status: AC

## 2017-02-01 MED ORDER — CLONAZEPAM 0.5 MG PO TABS: 0.5000 mg | ORAL_TABLET | Freq: Two times a day (BID) | ORAL | 0 refills | Status: AC | PRN

## 2017-02-01 MED ORDER — GUAIFENESIN ER 600 MG PO TB12: 600.0000 mg | ORAL_TABLET | Freq: Two times a day (BID) | ORAL | 0 refills | Status: AC

## 2017-02-01 MED ORDER — PREMIER PROTEIN SHAKE: 11.0000 [oz_av] | Freq: Two times a day (BID) | ORAL | 0 refills | Status: DC

## 2017-02-01 MED ORDER — IPRATROPIUM-ALBUTEROL 0.5-2.5 (3) MG/3ML IN SOLN: 3.0000 mL | Freq: Four times a day (QID) | RESPIRATORY_TRACT | 0 refills | Status: AC | PRN

## 2017-02-01 MED ORDER — AMOXICILLIN-POT CLAVULANATE 875-125 MG PO TABS: 1.0000 | ORAL_TABLET | Freq: Two times a day (BID) | ORAL | 0 refills | Status: AC

## 2017-02-01 NOTE — Progress Notes (Signed)
Patient being discharged today to pursue radiation treatment in [place]. Weight stable. Heart rate elevated. Denies pain. Reports intermittent episodes of dyspnea that induce a panic attack. Audible wheezing and congestion noted. Reports a productive cough with yellow blood tinged sputum. Reports amount of hemoptysis is less than before admission. Reports shortness of breath.   BP 103/80 (BP Location: Right Arm, Patient Position: Sitting, Cuff Size: Normal)   Pulse (!) 128   Resp 20   Wt 106 lb 3.2 oz (48.2 kg)   SpO2 95%   BMI 16.63 kg/m  Wt Readings from Last 3 Encounters:  01/27/17 104 lb 11.2 oz (47.5 kg)  02/01/17 106 lb 3.2 oz (48.2 kg)

## 2017-02-01 NOTE — Progress Notes (Signed)
Patient continues to cope with new diagnosis with family and friends supporting her.      02/01/17 1523  Clinical Encounter Type  Visited With Patient and family together  Visit Type Follow-up;Initial;Psychological support;Spiritual support;Social support  Spiritual Encounters  Spiritual Needs Emotional  Stress Factors  Patient Stress Factors Health changes;Major life changes  Family Stress Factors Major life changes;Health changes  Brittany Anthony

## 2017-02-01 NOTE — Progress Notes (Signed)
  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  BRONCHIAL BRUSHING(SPECIMEN 3 OF 3 COLLECTED 01/29/17): NON-SMALL CELL CARCINOMA.   Current Dose: 6 Gy     Planned Dose:  6 Gy  Narrative . . . . . . . . The patient presents for routine under treatment assessment.                                 Patient being discharged today to pursue radiation treatment in [place]. Weight stable. Heart rate elevated. Denies pain. Reports intermittent episodes of dyspnea that induce a panic attack. Audible wheezing and congestion noted. Reports a productive cough with yellow blood tinged sputum. Reports amount of hemoptysis is less than before admission. Reports shortness of breath.                                  Set-up films were reviewed.                                 The chart was checked. Physical Findings. . .  weight is 106 lb 3.2 oz (48.2 kg). Her blood pressure is 103/80 and her pulse is 128 (abnormal). Her respiration is 20 and oxygen saturation is 95%. . Weight essentially stable.  Presents in wheelchair. No significant changes. Lungs are clear to auscultation bilaterally. Heart has regular rate and rhythm. Impression . . . . . . . The patient is tolerating radiation. Plan . . . . . . . . . . . . Patient being discharged today to pursue radiation treatment in [place]. CT scan of the abdomen and pelvis showed no evidence metastatic disease. The patient's MRI the brain also showed no evidence of metastasis.  ________________________________   Blair Promise, PhD, MD  This document serves as a record of services personally performed by Gery Pray, MD. It was created on his behalf by Arlyce Harman, a trained medical scribe. The creation of this  record is based on the scribe's personal observations and the provider's statements to them. This document has been checked and approved by the attending provider.

## 2017-02-01 NOTE — Care Management Note (Signed)
Case Management Note  Patient Details  Name: Brittany Anthony MRN: [id number] Date of Birth: July 07, 1961  Subjective/Objective: [facility] dme rep Kim aware of 02 sats qualifying for home 02, & neb machine order-will deliver to rm prior d/c. [facility] is following for charity care. No further CM needs.                  Action/Plan:d/c home w/DME   Expected Discharge Date:                  Expected Discharge Plan:  Home/Self Care  In-House Referral:     Discharge planning Services  CM Consult, [facility], Medication Assistance  Post Acute Care Choice:    Choice offered to:     DME Arranged:  Oxygen, Nebulizer machine DME Agency:  [place]:    [facility] Agency:     Status of Service:  Completed, signed off  If discussed at [place] of Stay Meetings, dates discussed:    Additional Comments:  Dessa Phi, RN 02/01/2017, 11:42 AM

## 2017-02-01 NOTE — Progress Notes (Signed)
I spoke with the patient by phone this morning. She appears to be doing well since radiotherapy yesterday. She had some trouble feeling short of breath overnight but her nebulizer treatments have helped this subside. She's not noticing any significant hemoptysis in the last day. I've coordinated for Dr. Orlene Erm and Dr. Hinton Rao to see the patient this coming week to start discussion about concurrent chemo/radiation for what we believe now is stage III lung cancer. They will contact her, and the patient will receive her 2nd and final treatment with [place], but as above continue at [facility] center for the remainder of her treatment. She can discharge from our perspective when internal medicine feels she's ready.     Carola Rhine, PAC

## 2017-02-01 NOTE — Discharge Summary (Signed)
Discharge Summary  Brittany Anthony QQP:[id number] DOB: [birth year]  PCP: No PCP Per Patient  Admit date: 01/27/2017 Discharge date: 02/01/2017  Time spent: >39mns, more than 50% time spent on coordination of care and patient counseling.   Recommendations for Outpatient Follow-up:  1. F/u with PMD within a week  for hospital discharge follow up, repeat cbc/bmp at follow up 2. f/u with oncology and radiation oncology as listed , outpatient PET scan per local oncologist 3. f/u with pulmonology for outpatient PFT testing  Discharge Diagnoses:  Active Hospital Problems   Diagnosis Date Noted  . Hemoptysis 01/27/2017  . Protein-calorie malnutrition, severe 01/28/2017  . Lung mass 01/27/2017  . SOB (shortness of breath) 01/27/2017  . Acute bronchitis 01/27/2017  . Respiratory insufficiency 01/27/2017  . Acute respiratory insufficiency 01/27/2017  . Hyperglycemia 01/27/2017  . COPD exacerbation ([place] 01/27/2017  . UTI (urinary tract infection) 01/27/2017    Resolved Hospital Problems   Diagnosis Date Noted Date Resolved  No resolved problems to display.    Discharge Condition: stable  Diet recommendation: regular diet   Filed Weights   01/27/17 1654  Weight: 47.5 kg (104 lb 11.2 oz)    History of present illness:  [facility]patient Specialists:  [facility]  Chief Complaint: SOB  HPI: Brittany Anthony a 56y.o. female with no significant medical history except GERD and 40 pyhx of tobacco abuse presenting with 2 weeks history of progressing worsening sob and cough productive bloody sputum associated with low grade fever without chills. No history of chest pain, diaphoresis, but her symptoms worsened over the last 3 days despite mucinex and [facility]     ED Course: She was hypoxic with with wheezing and was treated with supllemental O2 and nebs. CXR was c/w COPD without pneuonia and her CT  Angio was cooncerning for bronchiogenic pneumonia.  CTA chest on 4/1 from outside hospital: 1. No evidence of a pulmonary embolus. 2. Abnormal soft tissue in the mediastinum extending to surround the right hilar structures highly suspicious for a primary bronchogenic carcinoma. There is associated mediastinal and left hilar adenopathy. 3. 14 mm nodule in the left upper lobe and 4 mm nodule in the right lower lobe suspicious for metastatic disease. 4. Significant bilateral, predominantly lower lung, bronchial wall<BR>thickening with bronchial secretions.   [facility]  Review of Systems: She has lost about 30 lbs weight over the last year, otherwise as per HPI on 10 point review of systems negative  Hospital Course:  Principal Problem:   Hemoptysis Active Problems:   Lung mass   SOB (shortness of breath)   Acute bronchitis   Respiratory insufficiency   Acute respiratory insufficiency   Hyperglycemia   COPD exacerbation (HCC)   UTI (urinary tract infection)   Protein-calorie malnutrition, severe   COPD exacerbation/acute hypoxic respiratory failure.  She presented to outside hospital with hemoptysis, wheezing, low grade fever, hypoxia She received steroids initially, this is stopped due to wheezing has resolved and she has significant leukocytosis, she received rocephin/zitrho, then zithro alone, she remain has significant leukocytosis, abx change to unasyn to cover possible post obstructive pneumonia,  Culture obtained from bronchoscopy with normal oral flora, leukocytosis resolved on unasyn, she is discharged on augmentin to finish abx treatment course. She is discharged on home oxygen and home nebulizer. Prn inhalor. She will need to have outpatient PFT. Appreciated pulmonology input.  Mediastinal mass, endobronchial lesions  CT chest at [facility] positive for abnormal soft tissue mass surrounding the right hilar structures highly  suspicious for primary bronchogenic carcinoma. 14 mm nodule on the left upper lobe and 4 mm nodule on the right lower lobe suspicious for metastasis. Bronchial wall thickening.   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  MRI brain no brain mets, Mild chronic small vessel ischemic disease. CT ab/pel: No evidence for metastatic disease within the abdomen or pelvis. Aortic atherosclerosis  She has had simulation on 4/4, she started  xrt on 4/5, and on 4/6, she tolerated radiation therapy, she is discharged home after radiation therapy on 4/6, rad onc referred patient to local oncology and rad onc (per patient's requests)  Rad onc input greatly appreciated,        Hemoptysis. hgb stable, continue monitor. Home meds asa stopped.  Tobacco abuser. Continue to encourage aggressive Smoking cessation  Severe malnutrition.  Body mass index is 16.4 kg/m. Malnutritionrelated to catabolic illnessas evidenced by 26 percent weight loss in one year, severe depletion of muscle mass, severe depletion of body fat. Nutrition input appreciated, Will continue on nutritional supplements.    Code Status: full  Family Communication: patient   Disposition Plan: home on 4/6 with rad onc and pulmonology clearance   Consultants:  Pulmonology/critical care  Rad onc  Palliative care  Procedures:  Bronchoscope with biopsy on 4/3   Antibiotics:  Rocephin/zithromax from admission  unasyn from 4/4   Discharge Exam: BP 116/80 (BP Location: Right Arm)   Pulse (!) 102   Temp 98.5 F (36.9 C) (Oral)   Resp 17   Ht _0  (1.702 m)   Wt 47.5 kg (104 lb 11.2 oz)   SpO2 94%   BMI 16.40 kg/m   General: Not in pain or dyspnea, thin, malnourished Cardiovascular: RRR Respiratory: good aeration, no wheezing,  no rales, no rhonchi Extremity: no edema  Discharge Instructions You were cared for by a hospitalist during your hospital stay. If you have any questions about your discharge medications or the care you received while you were in the hospital after you are discharged, you can call the unit and asked to speak with the hospitalist on call if the hospitalist that took care of you is not available. Once you are discharged, your primary care physician will handle any further medical issues. Please note that NO REFILLS for any discharge medications will be authorized once you are discharged, as it is imperative that you return to your primary care physician (or establish a relationship with a primary care physician if you do not have one) for your aftercare needs so that they can reassess your need for medications and monitor your lab values.  Discharge Instructions    DME Nebulizer machine    Complete by:  As directed    Patient needs a nebulizer to treat with the following condition:   COPD (chronic obstructive pulmonary disease) ([place]) Lung cancer ([place])     Diet general    Complete by:  As directed    For home use only DME oxygen    Complete by:  As directed    Mode or (Route):  Nasal cannula   Liters per Minute:  2   Frequency:  Continuous (stationary and portable oxygen unit needed)   Oxygen conserving device:  Yes   Oxygen delivery system:  Gas  Increase activity slowly    Complete by:  As directed      Allergies as of 02/01/2017   No Known Allergies     Medication List    STOP taking these medications   aspirin 325 MG tablet     TAKE these medications   acetaminophen 325 MG tablet Commonly known as:  TYLENOL Take 325 mg by mouth every 6 (six) hours as needed for fever.   albuterol 108 (90 Base) MCG/ACT inhaler Commonly known as:  PROVENTIL HFA;VENTOLIN HFA Inhale 2 puffs into the lungs every 6 (six) hours as needed for wheezing or shortness of breath.     amoxicillin-clavulanate 875-125 MG tablet Commonly known as:  AUGMENTIN Take 1 tablet by mouth 2 (two) times daily.   clonazePAM 0.5 MG tablet Commonly known as:  KLONOPIN Take 1 tablet (0.5 mg total) by mouth 2 (two) times daily as needed for anxiety.   famotidine 20 MG tablet Commonly known as:  PEPCID Take 1 tablet (20 mg total) by mouth at bedtime.   guaiFENesin 600 MG 12 hr tablet Commonly known as:  MUCINEX Take 1 tablet (600 mg total) by mouth 2 (two) times daily.   HYDROcodone-acetaminophen 5-325 MG tablet Commonly known as:  NORCO/VICODIN Take 1 tablet by mouth every 6 (six) hours as needed for moderate pain.   ipratropium-albuterol 0.5-2.5 (3) MG/3ML Soln Commonly known as:  DUONEB Take 3 mLs by nebulization every 6 (six) hours as needed.   protein supplement shake Liqd Commonly known as:  PREMIER PROTEIN Take 325 mLs (11 oz total) by mouth 2 (two) times daily between meals.            Durable Medical Equipment        Start     Ordered   02/01/17 0000  For home use only DME oxygen    Question Answer Comment  Mode or (Route) Nasal cannula   Liters per Minute 2   Frequency Continuous (stationary and portable oxygen unit needed)   Oxygen conserving device Yes   Oxygen delivery system Gas      02/01/17 1141   02/01/17 0000  DME Nebulizer machine    Question Answer Comment  Patient needs a nebulizer to treat with the following condition COPD (chronic obstructive pulmonary disease) ([place])   Patient needs a nebulizer to treat with the following condition Lung cancer ([place])      02/01/17 1153   01/31/17 0954  For home use only DME Nebulizer machine  Once    Question:  Patient needs a nebulizer to treat with the following condition  Answer:  COPD (chronic obstructive pulmonary disease) ([place])   01/31/17 0630   01/31/17 0954  For home use only DME Nebulizer/meds  Once    Question:  Patient needs a nebulizer to treat with the following condition  Answer:  COPD  (chronic obstructive pulmonary disease) ([place])   01/31/17 0954     No Known Allergies Follow-up Information    Rigoberto Noel., MD Follow up.   Specialty:  Pulmonary Disease Why:  as needed will need outpatient lung function test ( PFT) Contact information: [address] [place] [address] [phone]        Ellard Artis., MD Follow up.   Specialty:  Radiation Oncology Why:  squamous cell lung cancer Contact information: [place] [address] [phone]        MCCARTY,CHRISTINE H, MD Follow up.   Specialty:  Oncology Why:  squamous lung  cancer Contact information: [address]. [place] [address] [phone]        need to establish care with a primary care physician Follow up.        [place] Follow up.   Why:  Home oxygen, nebulizer machine Contact information: [address] [place]  [address] [phone]        [facility] Follow up.   Contact information: [address] [place] [address] [phone]            The results of significant diagnostics from this hospitalization (including imaging, microbiology, ancillary and laboratory) are listed below for reference.    Significant Diagnostic Studies: Mr Jeri Cos MH Contrast  Result Date: 01/30/2017 CLINICAL DATA:  New diagnosis lung cancer, here for staging. EXAM: MRI HEAD WITHOUT AND WITH CONTRAST TECHNIQUE: Multiplanar, multiecho pulse sequences of the brain and surrounding structures were obtained without and with intravenous contrast. CONTRAST:  31m MULTIHANCE GADOBENATE DIMEGLUMINE 529 MG/ML IV SOLN COMPARISON:  None. FINDINGS: INTRACRANIAL CONTENTS: No reduced diffusion to suggest acute ischemia. No susceptibility artifact to suggest hemorrhage. The ventricles and sulci are normal for patient's age. Scattered supratentorial subcentimeter white matter FLAIR T2 hyperintensities. No suspicious  parenchymal signal, masses, mass effect. No abnormal intraparenchymal or extra-axial enhancement. No abnormal extra-axial fluid collections. No extra-axial masses. VASCULAR: Normal major intracranial vascular flow voids present at skull base. SKULL AND UPPER CERVICAL SPINE: Partially empty sella. No suspicious calvarial bone marrow signal. Craniocervical junction maintained. SINUSES/ORBITS: The mastoid air-cells and included paranasal sinuses are well-aerated.The included ocular globes and orbital contents are non-suspicious. OTHER: None. IMPRESSION: No intracranial metastasis nor acute intracranial process. Mild chronic small vessel ischemic disease. Electronically Signed   By: CElon AlasM.D.   On: 01/30/2017 21:50   Ct Abdomen Pelvis W Contrast  Result Date: 01/31/2017 CLINICAL DATA:  Staging evaluation with recent diagnosis lung carcinoma. EXAM: CT ABDOMEN AND PELVIS WITH CONTRAST TECHNIQUE: Multidetector CT imaging of the abdomen and pelvis was performed using the standard protocol following bolus administration of intravenous contrast. CONTRAST:  1041mISOVUE-300 IOPAMIDOL (ISOVUE-300) INJECTION 61% COMPARISON:  Chest CT 01/27/2017 FINDINGS: Lower chest: Normal heart size. Re- demonstrated nodularity within the right middle lobe. Cyst within the left lower lobe. No pleural effusion. Hepatobiliary: Liver is normal in size and contour. No focal lesion is identified. Gallbladder is unremarkable. No intrahepatic or extrahepatic biliary ductal dilatation. Pancreas: Note is made of pancreatic divisum. Spleen: Unremarkable Adrenals/Urinary Tract: Normal adrenal glands. Kidneys enhance symmetrically with contrast. No hydronephrosis. Urinary bladder is distended. Stomach/Bowel: No abnormal bowel wall thickening or evidence for bowel obstruction. Normal morphology of the stomach. No free fluid or free intraperitoneal air. Vascular/Lymphatic: Normal caliber abdominal aorta. Peripheral calcified atherosclerotic  plaque. No retroperitoneal lymphadenopathy. Reproductive: Uterus is unremarkable. Other: None. Musculoskeletal: Lumbar spine degenerative changes. No aggressive or acute appearing osseous lesions. IMPRESSION: No evidence for metastatic disease within the abdomen or pelvis. Aortic atherosclerosis. Electronically Signed   By: DrLovey Newcomer.D.   On: 01/31/2017 11:02    Microbiology: Recent Results (from the past 240 hour(s))  Culture, bal-quantitative     Status: None   Collection Time: 01/29/17 11:12 AM  Result Value Ref Range Status   Specimen Description BRONCHIAL ALVEOLAR LAVAGE  Final   Special Requests NONE  Final   Gram Stain   Final    RARE WBC PRESENT,BOTH PMN AND MONONUCLEAR NO ORGANISMS SEEN    Culture  Final    Consistent with normal respiratory flora. Performed at [facility], [place] [address] [place], [place] [address]    Report Status 02/01/2017 FINAL  Final  Acid Fast Smear (AFB)     Status: None   Collection Time: 01/29/17 11:12 AM  Result Value Ref Range Status   AFB Specimen Processing Concentration  Final   Acid Fast Smear Negative  Final    Comment: (NOTE) Performed At: [facility] [place], [place] [id number] Lindon Romp MD AS:[id number]    Source (AFB) BRONCHIAL ALVEOLAR LAVAGE  Final  Fungus Culture With Stain     Status: None (Preliminary result)   Collection Time: 01/29/17 11:12 AM  Result Value Ref Range Status   Fungus Stain Final report  Final    Comment: (NOTE) Performed At: [facility] [place], [place] [id number] Lindon Romp MD [id number]    Fungus (Mycology) Culture PENDING  Incomplete   Fungal Source BRONCHIAL ALVEOLAR LAVAGE  Final  Fungus Culture Result     Status: None   Collection Time: 01/29/17 11:12 AM  Result Value Ref Range Status   Result 1 Comment  Final    Comment: (NOTE) KOH/Calcofluor preparation:  no fungus observed. Performed At: [facility] [place], [place] [id number] Lindon Romp MD ZC:[id number]      Labs: Basic Metabolic Panel:  Recent Labs Lab 01/27/17 1850 01/29/17 0545 01/30/17 0534 02/01/17 0608  NA 134* 140 137 131*  K 3.9 3.8 3.9 4.1  CL 101 101 98* 95*  CO2 _0 GLUCOSE 184* 108* 97 114*  BUN _1 CREATININE 0.49 0.58 0.58 0.62  CALCIUM 8.6* 9.3 9.0 8.5*  MG 1.6*  --   --  2.1  PHOS 3.3  --   --   --    Liver Function Tests:  Recent Labs Lab 01/27/17 1850  AST 16  ALT 10*  ALKPHOS 79  BILITOT 0.3  PROT 7.3  ALBUMIN 2.7*   No results for input(s): LIPASE, AMYLASE in the last 168 hours. No results for input(s): AMMONIA in the last 168 hours. CBC:  Recent Labs Lab 01/27/17 1850 01/29/17 0545 01/30/17 0534 02/01/17 0608  WBC 9.6 26.8* 20.5* 11.0*  NEUTROABS 8.9* 22.7* 14.4* 8.0*  HGB 10.3* 11.1* 11.3* 11.3*  HCT 31.1* 33.9* 34.6* 35.0*  MCV 73.9* 76.0* 77.1* 75.4*  PLT 529* 561* 550* 553*   Cardiac Enzymes:  Recent Labs Lab 01/27/17 1850 01/28/17 0014 01/28/17 0440  TROPONINI 0.63* 0.46* 0.36*   BNP: BNP (last 3 results)  Recent Labs  01/27/17 1850  BNP 177.0*    ProBNP (last 3 results) No results for input(s): PROBNP in the last 8760 hours.  CBG:  Recent Labs Lab 01/31/17 0747 01/31/17 1140 01/31/17 1715 01/31/17 2118 02/01/17 0724  GLUCAP 91 94 102* 127* 101*       Signed:  Castle Lamons MD, PhD  [facility] 02/01/2017, 12:12 PM

## 2017-02-01 NOTE — Progress Notes (Addendum)
SATURATION QUALIFICATIONS: (This note is used to comply with regulatory documentation for home oxygen)  Patient Saturations on Room Air at Rest = 96%  Patient Saturations on Room Air while Ambulating = 88%  Patient Saturations on 2 Liters of oxygen while Ambulating = 94%  Please briefly explain why patient needs home oxygen:

## 2017-02-04 ENCOUNTER — Ambulatory Visit: Payer: Self-pay

## 2017-02-04 NOTE — Progress Notes (Signed)
  Radiation Oncology         [phone] ________________________________  Name: Tiyana Galla MRN: [id number]  Date: 02/01/2017  DOB: [birth year]  End of Treatment Note  Diagnosis: Stage III NSCLC, favoring squamous cell carcinoma   Indication for treatment:  Respiratory distress  Radiation treatment dates:   01/31/17 - 02/01/17  Site/dose:   Chest: 6 Gy in 2 fractions  Beams/energy: 10x, 15x 3-D plan, AP/PA  Narrative: The patient tolerated radiation treatment relatively well. The patient reported intermittent episodes of dyspnea that would induce panic attacks. She had a productive cough with yellow-blood tinged sputum. She had less hemoptysis than before her admission. The patient continued to have SOB and was on oxygen via nasal cannula.  Plan: The patient has completed two fractions of palliative radiation treatment at [facility]. The patient lives in [place] and will complete her course of radiotherapy at [facility] in [place] by Dr. Orlene Erm and chemotherapy by Dr. Hinton Rao.  MRI of the brain on 34/4/18 was negative. CT abd/pelvis on 01/31/17 revealed no evidence of metastatic disease. -----------------------------------  Blair Promise, PhD, MD  This document serves as a record of services personally performed by Gery Pray, MD. It was created on his behalf by Darcus Austin, a trained medical scribe. The creation of this record is based on the scribe's personal observations and the provider's statements to them. This document has been checked and approved by the attending provider.

## 2017-02-05 ENCOUNTER — Ambulatory Visit: Payer: Self-pay | Admitting: Radiation Oncology

## 2017-02-05 ENCOUNTER — Telehealth: Payer: Self-pay | Admitting: *Deleted

## 2017-02-05 ENCOUNTER — Ambulatory Visit: Payer: Self-pay

## 2017-02-05 DIAGNOSIS — Z716 Tobacco abuse counseling: Secondary | ICD-10-CM

## 2017-02-05 DIAGNOSIS — J449 Chronic obstructive pulmonary disease, unspecified: Secondary | ICD-10-CM

## 2017-02-05 DIAGNOSIS — D649 Anemia, unspecified: Secondary | ICD-10-CM

## 2017-02-05 DIAGNOSIS — R042 Hemoptysis: Secondary | ICD-10-CM

## 2017-02-05 DIAGNOSIS — C3490 Malignant neoplasm of unspecified part of unspecified bronchus or lung: Secondary | ICD-10-CM

## 2017-02-05 NOTE — Telephone Encounter (Signed)
On fax medical records to [facility]

## 2017-02-06 ENCOUNTER — Ambulatory Visit: Payer: Self-pay

## 2017-02-07 ENCOUNTER — Ambulatory Visit: Payer: Self-pay

## 2017-02-08 ENCOUNTER — Ambulatory Visit: Payer: Self-pay

## 2017-02-11 ENCOUNTER — Ambulatory Visit: Payer: Self-pay

## 2017-02-12 ENCOUNTER — Ambulatory Visit: Payer: Self-pay

## 2017-02-13 ENCOUNTER — Ambulatory Visit: Payer: Self-pay

## 2017-02-14 DIAGNOSIS — C3490 Malignant neoplasm of unspecified part of unspecified bronchus or lung: Secondary | ICD-10-CM

## 2017-02-26 LAB — FUNGUS CULTURE WITH STAIN

## 2017-02-26 LAB — FUNGUS CULTURE RESULT

## 2017-02-26 LAB — FUNGAL ORGANISM REFLEX

## 2017-03-14 LAB — ACID FAST CULTURE WITH REFLEXED SENSITIVITIES (MYCOBACTERIA): Acid Fast Culture: NEGATIVE

## 2017-03-21 ENCOUNTER — Ambulatory Visit: Payer: Self-pay | Admitting: Radiation Oncology

## 2017-03-21 DIAGNOSIS — C3490 Malignant neoplasm of unspecified part of unspecified bronchus or lung: Secondary | ICD-10-CM | POA: Diagnosis not present

## 2017-05-17 DIAGNOSIS — C3490 Malignant neoplasm of unspecified part of unspecified bronchus or lung: Secondary | ICD-10-CM | POA: Diagnosis not present

## 2017-05-17 DIAGNOSIS — M546 Pain in thoracic spine: Secondary | ICD-10-CM | POA: Diagnosis not present

## 2017-05-27 DIAGNOSIS — C7951 Secondary malignant neoplasm of bone: Secondary | ICD-10-CM | POA: Diagnosis not present

## 2017-05-27 DIAGNOSIS — C3490 Malignant neoplasm of unspecified part of unspecified bronchus or lung: Secondary | ICD-10-CM | POA: Diagnosis not present

## 2017-05-27 DIAGNOSIS — G893 Neoplasm related pain (acute) (chronic): Secondary | ICD-10-CM | POA: Diagnosis not present

## 2017-06-25 DIAGNOSIS — R109 Unspecified abdominal pain: Secondary | ICD-10-CM | POA: Diagnosis not present

## 2017-06-25 DIAGNOSIS — C7951 Secondary malignant neoplasm of bone: Secondary | ICD-10-CM | POA: Diagnosis not present

## 2017-06-25 DIAGNOSIS — G4700 Insomnia, unspecified: Secondary | ICD-10-CM | POA: Diagnosis not present

## 2017-06-25 DIAGNOSIS — C3490 Malignant neoplasm of unspecified part of unspecified bronchus or lung: Secondary | ICD-10-CM | POA: Diagnosis not present

## 2017-06-25 DIAGNOSIS — K5900 Constipation, unspecified: Secondary | ICD-10-CM | POA: Diagnosis not present

## 2017-06-25 DIAGNOSIS — E0590 Thyrotoxicosis, unspecified without thyrotoxic crisis or storm: Secondary | ICD-10-CM | POA: Diagnosis not present

## 2017-06-25 DIAGNOSIS — L97319 Non-pressure chronic ulcer of right ankle with unspecified severity: Secondary | ICD-10-CM | POA: Diagnosis not present

## 2017-06-25 DIAGNOSIS — S025XXA Fracture of tooth (traumatic), initial encounter for closed fracture: Secondary | ICD-10-CM | POA: Diagnosis not present

## 2017-07-23 DIAGNOSIS — G893 Neoplasm related pain (acute) (chronic): Secondary | ICD-10-CM | POA: Diagnosis not present

## 2017-07-23 DIAGNOSIS — E0590 Thyrotoxicosis, unspecified without thyrotoxic crisis or storm: Secondary | ICD-10-CM | POA: Diagnosis not present

## 2017-07-23 DIAGNOSIS — C3490 Malignant neoplasm of unspecified part of unspecified bronchus or lung: Secondary | ICD-10-CM | POA: Diagnosis not present

## 2017-07-23 DIAGNOSIS — L97309 Non-pressure chronic ulcer of unspecified ankle with unspecified severity: Secondary | ICD-10-CM | POA: Diagnosis not present

## 2017-07-23 DIAGNOSIS — H0520 Unspecified exophthalmos: Secondary | ICD-10-CM | POA: Diagnosis not present

## 2017-07-23 DIAGNOSIS — J449 Chronic obstructive pulmonary disease, unspecified: Secondary | ICD-10-CM | POA: Diagnosis not present

## 2017-07-23 DIAGNOSIS — R600 Localized edema: Secondary | ICD-10-CM | POA: Diagnosis not present

## 2017-07-23 DIAGNOSIS — C7951 Secondary malignant neoplasm of bone: Secondary | ICD-10-CM | POA: Diagnosis not present

## 2017-07-23 DIAGNOSIS — R140 Abdominal distension (gaseous): Secondary | ICD-10-CM | POA: Diagnosis not present

## 2017-07-23 DIAGNOSIS — D72829 Elevated white blood cell count, unspecified: Secondary | ICD-10-CM | POA: Diagnosis not present

## 2017-08-20 DIAGNOSIS — R0600 Dyspnea, unspecified: Secondary | ICD-10-CM | POA: Diagnosis not present

## 2017-08-20 DIAGNOSIS — F064 Anxiety disorder due to known physiological condition: Secondary | ICD-10-CM | POA: Diagnosis not present

## 2017-08-20 DIAGNOSIS — G893 Neoplasm related pain (acute) (chronic): Secondary | ICD-10-CM | POA: Diagnosis not present

## 2017-08-20 DIAGNOSIS — C3490 Malignant neoplasm of unspecified part of unspecified bronchus or lung: Secondary | ICD-10-CM | POA: Diagnosis not present

## 2017-08-20 DIAGNOSIS — R531 Weakness: Secondary | ICD-10-CM | POA: Diagnosis not present

## 2017-08-20 DIAGNOSIS — C787 Secondary malignant neoplasm of liver and intrahepatic bile duct: Secondary | ICD-10-CM | POA: Diagnosis not present

## 2017-10-29 DEATH — deceased

## 2018-10-19 IMAGING — MR MR HEAD WO/W CM
10 of 13 series · 34 of 48 positions shown · IV contrast (multihance)
Comparison: None.

CLINICAL DATA: New diagnosis lung cancer, here for staging.

EXAM:
MRI HEAD WITHOUT AND WITH CONTRAST
TECHNIQUE: Multiplanar, multiecho pulse sequences of the brain and surrounding
structures were obtained without and with intravenous contrast.
CONTRAST:  10mL MULTIHANCE GADOBENATE DIMEGLUMINE 529 MG/ML IV SOLN

[Series 3: T1 · sagittal · 5.0mm · 0.47mm/px · 3 of 24 slices shown]
[im 1/24]
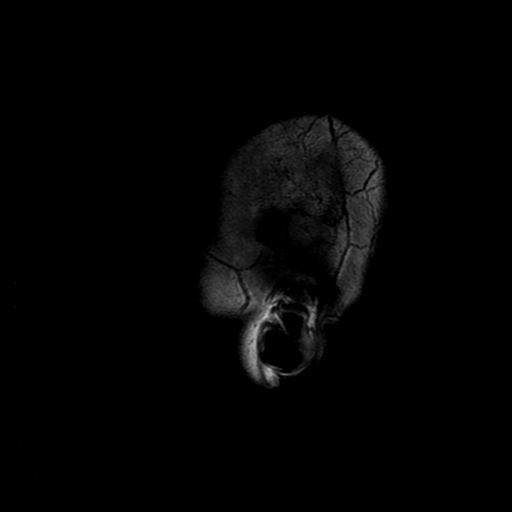
[im 12/24]
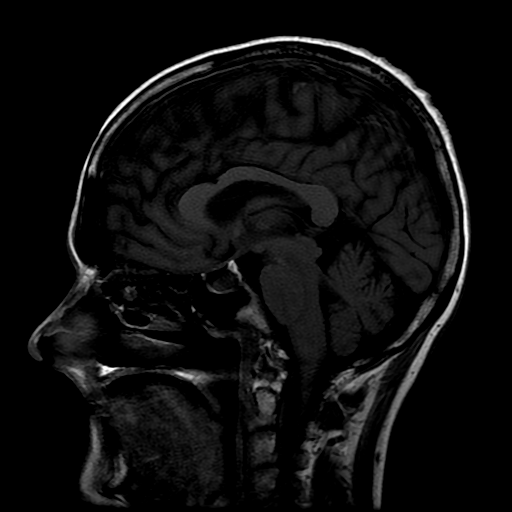
[im 24/24]
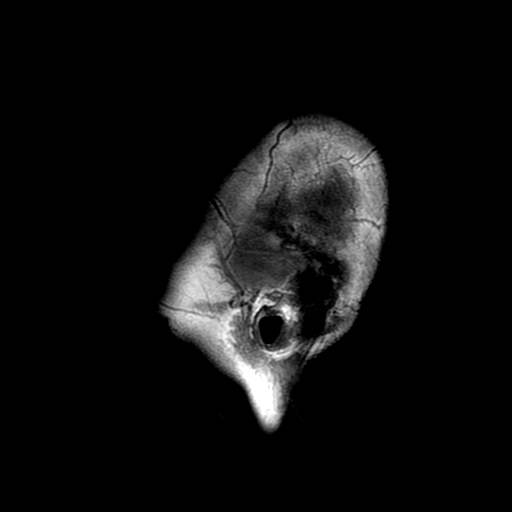

[Series 4: DWI · axial · 3.0mm · 1.09mm/px · z∈[-50,+88]mm · 8 of 94 slices shown (1 of 4)]
[im 1/94]
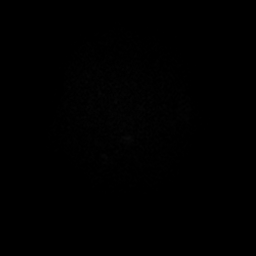
[im 11/94]
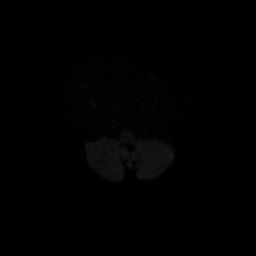
[im 32/94]
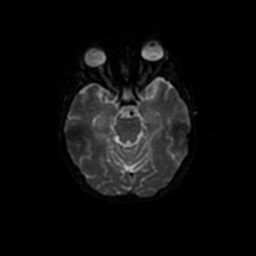
[im 42/94]
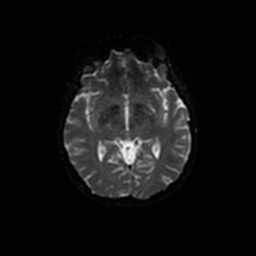
[im 52/94]
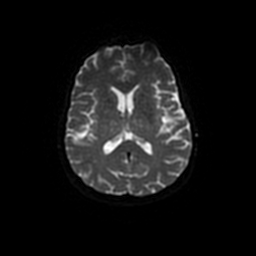
[im 63/94]
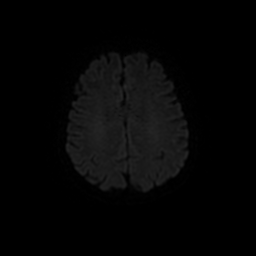
[im 83/94]
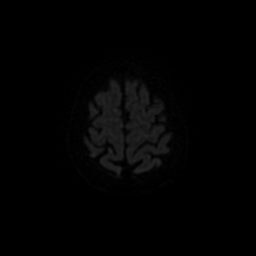
[im 94/94]
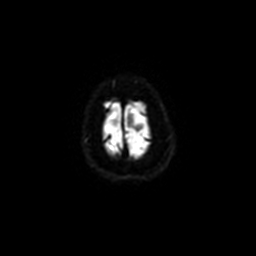

[Series 5: DWI · coronal · 5.0mm · 1.09mm/px · 6 of 66 slices shown (2 of 4)]
[im 1/66]
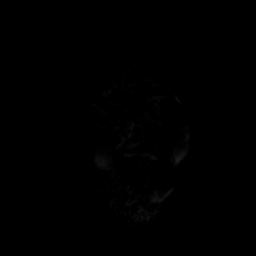
[im 14/66]
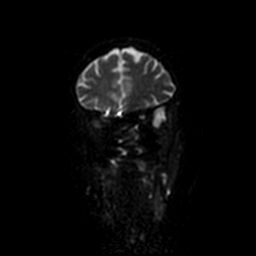
[im 27/66]
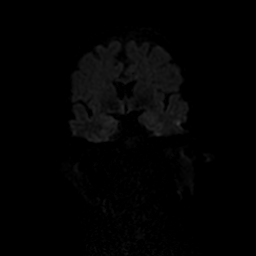
[im 40/66]
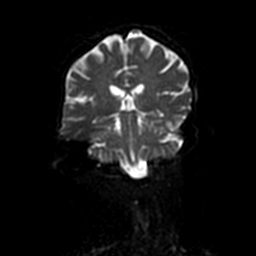
[im 53/66]
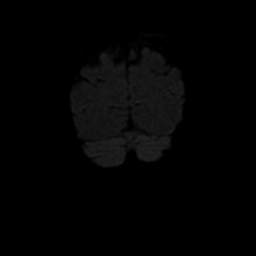
[im 66/66]
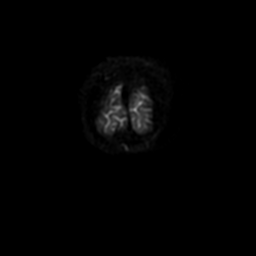

[Series 6: T2 · axial · 5.0mm · 0.43mm/px · z∈[-58,+88]mm · 2 of 22 slices shown]
[im 1/22]
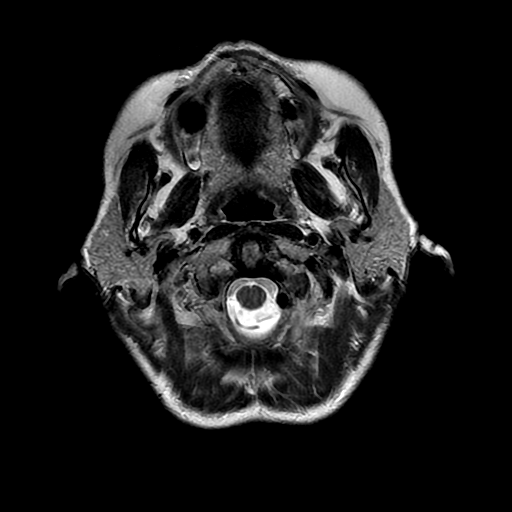
[im 22/22]
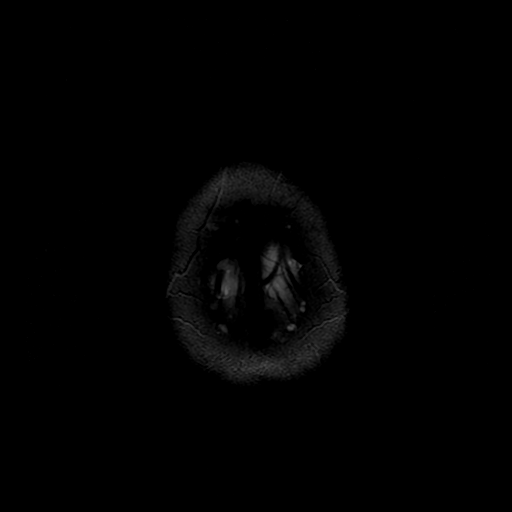

[Series 7: FLAIR · axial · 5.0mm · 0.43mm/px · z∈[-58,+88]mm · 2 of 22 slices shown]
[im 1/22]
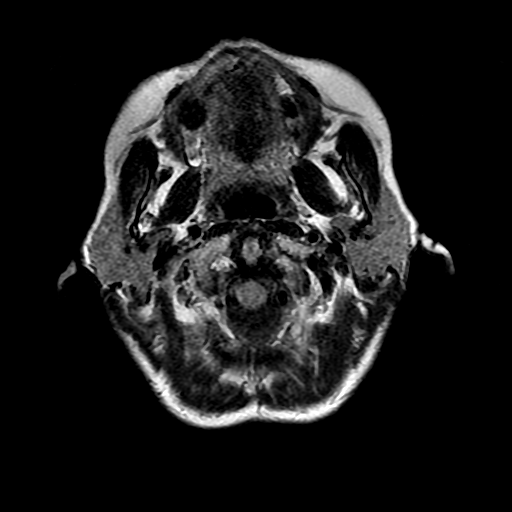
[im 22/22]
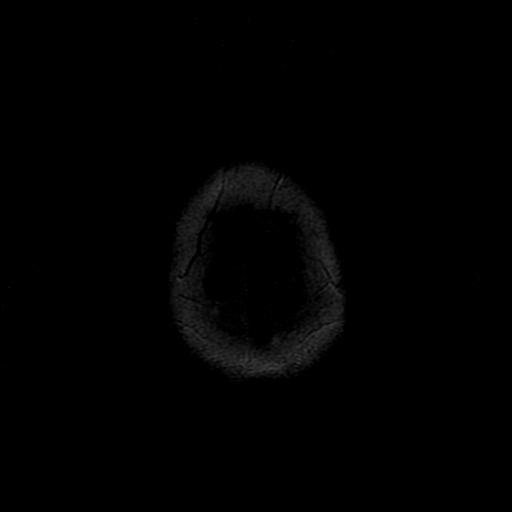

[Series 10: T2 post-contrast · coronal · 5.0mm · 0.45mm/px · 2 of 26 slices shown]
[im 1/26]
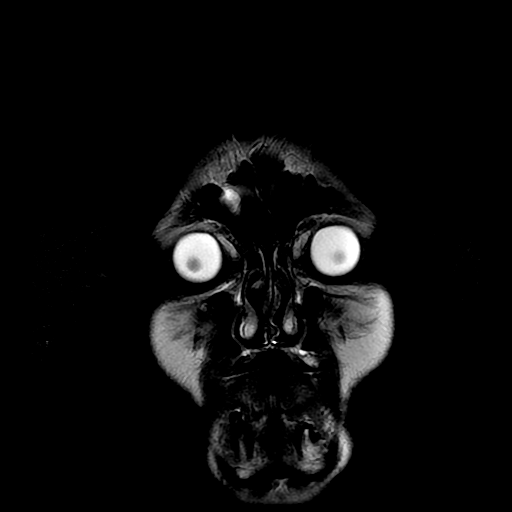
[im 26/26]
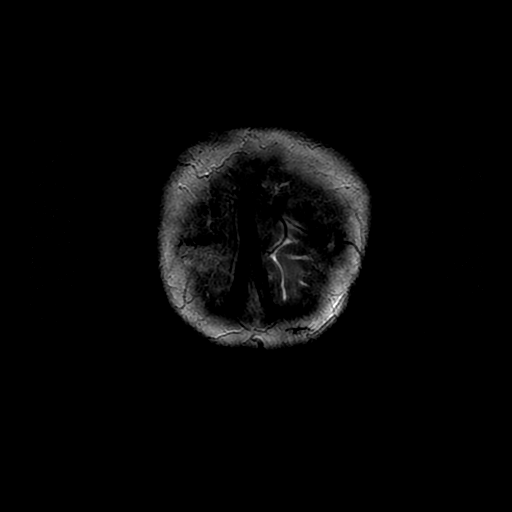

[Series 12: T1 post-contrast · coronal · 5.0mm · 0.45mm/px · 2 of 26 slices shown (1 of 2)]
[im 1/26]
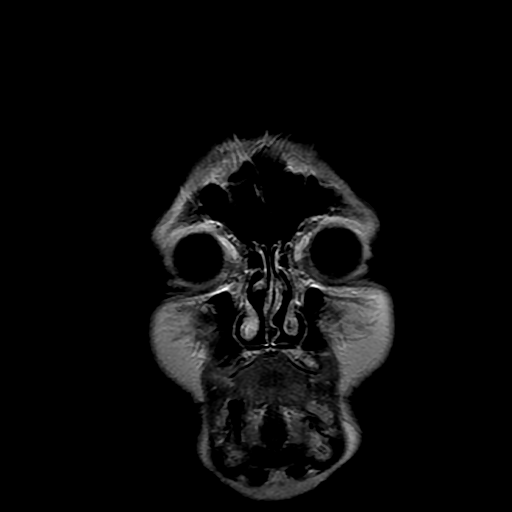
[im 26/26]
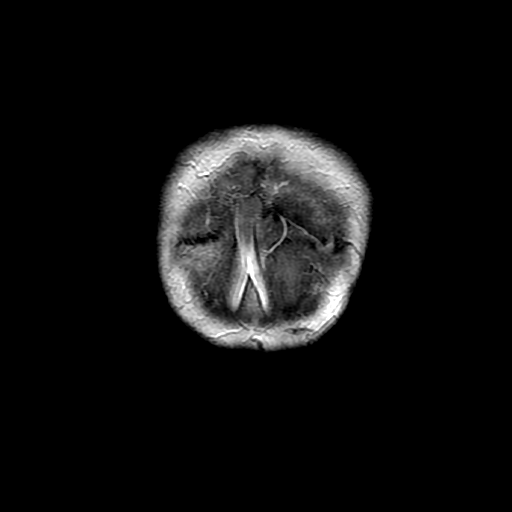

[Series 13: T1 post-contrast · sagittal · 5.0mm · 0.47mm/px · 2 of 24 slices shown (2 of 2)]
[im 1/24]
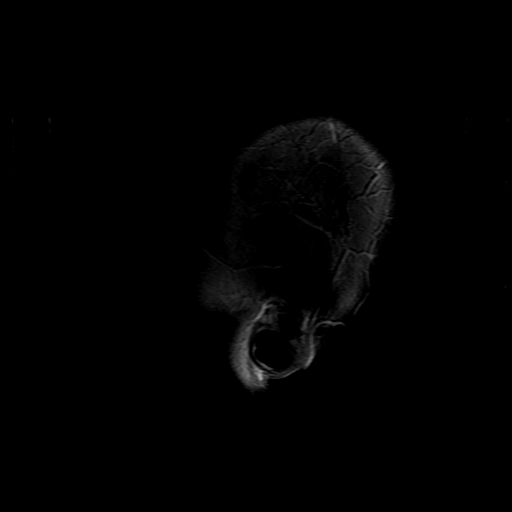
[im 24/24]
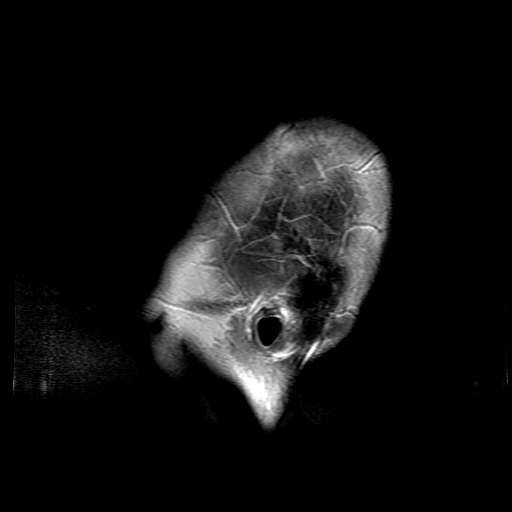

[Series 400: DWI · axial · 3.0mm · 1.09mm/px · z∈[-50,+88]mm · 4 of 45 slices shown (3 of 4)]
[im 1/45]
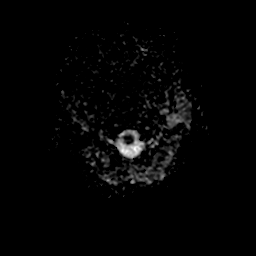
[im 15/45]
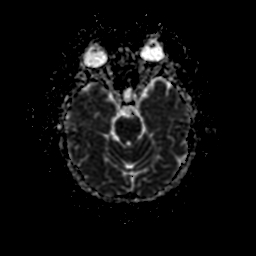
[im 30/45]
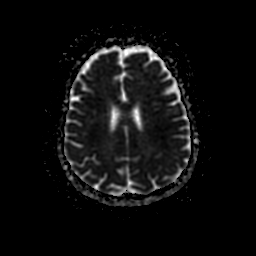
[im 45/45]
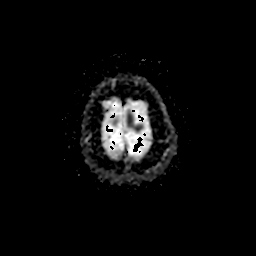

[Series 500: DWI · coronal · 5.0mm · 1.09mm/px · 3 of 33 slices shown (4 of 4)]
[im 1/33]
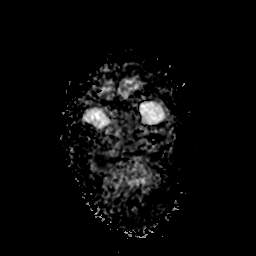
[im 17/33]
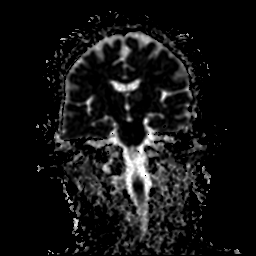
[im 33/33]
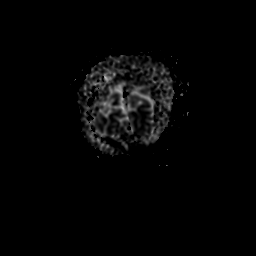

[34 of 48 positions shown; findings below may reference images not displayed]

FINDINGS: INTRACRANIAL CONTENTS: No reduced diffusion to suggest acute
ischemia. No susceptibility artifact to suggest hemorrhage. The
ventricles and sulci are normal for patient's age. Scattered
supratentorial subcentimeter white matter FLAIR T2 hyperintensities.
No suspicious parenchymal signal, masses, mass effect. No abnormal
intraparenchymal or extra-axial enhancement. No abnormal extra-axial
fluid collections. No extra-axial masses.

VASCULAR: Normal major intracranial vascular flow voids present at
skull base.

SKULL AND UPPER CERVICAL SPINE: Partially empty sella. No suspicious
calvarial bone marrow signal. Craniocervical junction maintained.

SINUSES/ORBITS: The mastoid air-cells and included paranasal sinuses
are well-aerated.The included ocular globes and orbital contents are
non-suspicious.

OTHER: None.
IMPRESSION: No intracranial metastasis nor acute intracranial process.

Mild chronic small vessel ischemic disease.

## 2019-01-14 ENCOUNTER — Emergency Department (HOSPITAL_COMMUNITY)
Admission: EM | Admit: 2019-01-14 | Discharge: 2019-01-14 | Discharge status: Absent without leave | Payer: Medicaid Other

## 2019-01-14 ENCOUNTER — Other Ambulatory Visit: Payer: Self-pay
